# Patient Record
Sex: Female | Born: 1970 | Race: White | Hispanic: No | Marital: Married | Smoking: Never smoker
Health system: Southern US, Community
[De-identification: ages and names within clinical notes are randomized; demographics above are authoritative.]

## PROBLEM LIST (undated history)

## (undated) DIAGNOSIS — Z860101 Personal history of adenomatous and serrated colon polyps: Secondary | ICD-10-CM

## (undated) DIAGNOSIS — C4491 Basal cell carcinoma of skin, unspecified: Secondary | ICD-10-CM

## (undated) DIAGNOSIS — Z9889 Other specified postprocedural states: Secondary | ICD-10-CM

## (undated) DIAGNOSIS — Z1501 Genetic susceptibility to malignant neoplasm of breast: Secondary | ICD-10-CM

## (undated) DIAGNOSIS — E878 Other disorders of electrolyte and fluid balance, not elsewhere classified: Secondary | ICD-10-CM

## (undated) DIAGNOSIS — Z86010 Personal history of colonic polyps: Secondary | ICD-10-CM

## (undated) DIAGNOSIS — M8580 Other specified disorders of bone density and structure, unspecified site: Secondary | ICD-10-CM

## (undated) DIAGNOSIS — R112 Nausea with vomiting, unspecified: Secondary | ICD-10-CM

## (undated) DIAGNOSIS — I341 Nonrheumatic mitral (valve) prolapse: Secondary | ICD-10-CM

## (undated) DIAGNOSIS — N920 Excessive and frequent menstruation with regular cycle: Secondary | ICD-10-CM

## (undated) DIAGNOSIS — N950 Postmenopausal bleeding: Secondary | ICD-10-CM

## (undated) DIAGNOSIS — R42 Dizziness and giddiness: Secondary | ICD-10-CM

## (undated) DIAGNOSIS — F419 Anxiety disorder, unspecified: Secondary | ICD-10-CM

## (undated) DIAGNOSIS — S30811A Abrasion of abdominal wall, initial encounter: Secondary | ICD-10-CM

## (undated) HISTORY — PX: LASER ABLATION OF THE CERVIX: SHX1949

## (undated) HISTORY — PX: OTHER SURGICAL HISTORY: SHX169

## (undated) HISTORY — DX: Other disorders of electrolyte and fluid balance, not elsewhere classified: E87.8

## (undated) HISTORY — DX: Excessive and frequent menstruation with regular cycle: N92.0

## (undated) HISTORY — DX: Personal history of adenomatous and serrated colon polyps: Z86.0101

## (undated) HISTORY — PX: WISDOM TOOTH EXTRACTION: SHX21

## (undated) HISTORY — DX: Basal cell carcinoma of skin, unspecified: C44.91

## (undated) HISTORY — DX: Other specified disorders of bone density and structure, unspecified site: M85.80

## (undated) HISTORY — DX: Genetic susceptibility to malignant neoplasm of breast: Z15.01

## (undated) HISTORY — DX: Personal history of colonic polyps: Z86.010

## (undated) HISTORY — PX: MYRINGOTOMY: SUR874

## (undated) HISTORY — PX: EYE SURGERY: SHX253

## (undated) HISTORY — DX: Postmenopausal bleeding: N95.0

---
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
## 1998-03-25 HISTORY — PX: EYE SURGERY: SHX253

## 2004-01-18 ENCOUNTER — Other Ambulatory Visit: Admission: RE | Admit: 2004-01-18 | Discharge: 2004-01-18 | Payer: Self-pay | Admitting: Obstetrics & Gynecology

## 2005-01-18 ENCOUNTER — Other Ambulatory Visit: Admission: RE | Admit: 2005-01-18 | Discharge: 2005-01-18 | Payer: Self-pay | Admitting: Obstetrics & Gynecology

## 2007-02-16 ENCOUNTER — Ambulatory Visit (HOSPITAL_COMMUNITY): Admission: RE | Admit: 2007-02-16 | Discharge: 2007-02-16 | Payer: Self-pay | Admitting: Obstetrics & Gynecology

## 2008-06-10 ENCOUNTER — Ambulatory Visit: Payer: Self-pay | Admitting: Gastroenterology

## 2008-06-10 DIAGNOSIS — R197 Diarrhea, unspecified: Secondary | ICD-10-CM | POA: Insufficient documentation

## 2008-06-10 LAB — CONVERTED CEMR LAB
ALT: 20 units/L (ref 0–35)
AST: 21 units/L (ref 0–37)
Albumin: 3.8 g/dL (ref 3.5–5.2)
BUN: 11 mg/dL (ref 6–23)
Basophils Absolute: 0.1 10*3/uL (ref 0.0–0.1)
Basophils Relative: 0.7 % (ref 0.0–3.0)
CRP, High Sensitivity: <1 (ref 0.00–5.00)
Calcium: 9.1 mg/dL (ref 8.4–10.5)
Chloride: 104 meq/L (ref 96–112)
Creatinine, Ser: 0.6 mg/dL (ref 0.4–1.2)
Glucose, Bld: 98 mg/dL (ref 70–99)
HCT: 40.4 % (ref 36.0–46.0)
Hemoglobin: 14.1 g/dL (ref 12.0–15.0)
Lymphs Abs: 2.2 10*3/uL (ref 0.7–4.0)
MCV: 92.7 fL (ref 78.0–100.0)
Monocytes Relative: 5.7 % (ref 3.0–12.0)
Neutro Abs: 6.2 10*3/uL (ref 1.4–7.7)
Potassium: 4.3 meq/L (ref 3.5–5.1)
Total Bilirubin: 0.7 mg/dL (ref 0.3–1.2)

## 2008-06-29 ENCOUNTER — Encounter (INDEPENDENT_AMBULATORY_CARE_PROVIDER_SITE_OTHER): Payer: Self-pay | Admitting: *Deleted

## 2009-03-25 DIAGNOSIS — R42 Dizziness and giddiness: Secondary | ICD-10-CM

## 2009-03-25 HISTORY — DX: Dizziness and giddiness: R42

## 2009-06-09 ENCOUNTER — Telehealth: Payer: Self-pay | Admitting: Gastroenterology

## 2010-04-24 NOTE — Progress Notes (Signed)
Summary: Schedule REV  Phone Note Outgoing Call Call back at [facility] Phone [phone]   Call placed by: Harlow Mares CMA Duncan Dull),  June 09, 2009 9:46 AM Call placed to: Patient Summary of Call: Left message on patients machine to call back. patient needs an office visit Initial call taken by: Harlow Mares CMA Duncan Dull),  June 09, 2009 9:46 AM

## 2016-01-30 NOTE — H&P (Signed)
Peggy Obrien is an 45 y.o. female. She is admitted with intermenstrual spotting, menorrhagia, and a suspected 1 cm endometrial polyp of saline infusion sonography.  Pertinent Gynecological History: Last mammogram: normal Date: 2017 Last pap: normal Date: 2014 OB History: G0, P0    No past medical history on file.  No past surgical history on file.  No family history on file.  Social History:  has no tobacco, alcohol, and drug history on file.  Allergies:  Allergies  Allergen Reactions  . Codeine Nausea And Vomiting  . Lorazepam Other (See Comments)    agitation  . Xyrem [Sodium Oxybate] Nausea And Vomiting    No prescriptions prior to admission.    Review of Systems  Constitutional: Negative.   HENT: Negative.   Eyes: Negative.   Respiratory: Negative.   Cardiovascular: Negative.   Gastrointestinal: Negative.     There were no vitals taken for this visit. Physical Exam  Constitutional: She is oriented to person, place, and time. She appears well-developed and well-nourished.  HENT:  Head: Normocephalic.  Eyes: Pupils are equal, round, and reactive to light.  Neck: Normal range of motion.  Cardiovascular: Normal rate.   Respiratory: Effort normal.  GI: Soft.  Genitourinary: Vagina normal and uterus normal.  Neurological: She is alert and oriented to person, place, and time.    Office SIS: Normal sized uterus.  1 cm endometrial polyp noted.  Assessment/Plan: Patient with history of menorrhagia for which she takes [place].  She would be receptive to a spontaneous conception and pregnancy and does not want to take hormones or do anything that would impair fertility.  She developed intermenstrual spotting and an office SIS showed a polyp.  She presents for hysteroscopy, possible endometrial polypectomy, and endometrial curettage.  Aniyha Tate D 01/30/2016, 8:09 PM

## 2016-01-30 NOTE — Patient Instructions (Signed)
Your procedure is scheduled on:  Friday, Nov. 10, 2017  [facility] at:  8:45 AM  Pick up the phone at the desk and dial [phone].  Call this number if you have problems the morning of surgery: [phone].  Remember: Do NOT eat food or drink after:  Midnight Thursday  Take these medicines the morning of surgery with a SIP OF WATER:  None  Stop ALL herbal medications at this time   Do NOT wear jewelry (body piercing), metal hair clips/bobby pins, make-up, or nail polish. Do NOT wear lotions, powders, or perfumes.  You may wear deodorant. Do NOT shave for 48 hours prior to surgery. Do NOT bring valuables to the hospital. Contacts, dentures, or bridgework may not be worn into surgery.  Have a responsible adult drive you home and stay with you for 24 hours after your procedure

## 2016-01-31 ENCOUNTER — Encounter (HOSPITAL_COMMUNITY)
Admission: RE | Admit: 2016-01-31 | Discharge: 2016-01-31 | Disposition: A | Discharge status: Home / Self Care | Payer: 59 | Source: Ambulatory Visit | Attending: Obstetrics & Gynecology | Admitting: Obstetrics & Gynecology

## 2016-01-31 ENCOUNTER — Encounter (HOSPITAL_COMMUNITY): Payer: Self-pay

## 2016-01-31 DIAGNOSIS — N923 Ovulation bleeding: Secondary | ICD-10-CM | POA: Diagnosis not present

## 2016-01-31 DIAGNOSIS — N938 Other specified abnormal uterine and vaginal bleeding: Secondary | ICD-10-CM | POA: Diagnosis not present

## 2016-01-31 DIAGNOSIS — N840 Polyp of corpus uteri: Secondary | ICD-10-CM | POA: Diagnosis not present

## 2016-01-31 DIAGNOSIS — N920 Excessive and frequent menstruation with regular cycle: Secondary | ICD-10-CM | POA: Diagnosis not present

## 2016-01-31 HISTORY — DX: Abrasion of abdominal wall, initial encounter: S30.811A

## 2016-01-31 HISTORY — DX: Nausea with vomiting, unspecified: R11.2

## 2016-01-31 HISTORY — DX: Other specified postprocedural states: Z98.890

## 2016-01-31 HISTORY — DX: Dizziness and giddiness: R42

## 2016-01-31 LAB — CBC
HCT: 43.1 % (ref 36.0–46.0)
Hemoglobin: 15.3 g/dL — ABNORMAL HIGH (ref 12.0–15.0)
MCH: 33.3 pg (ref 26.0–34.0)
MCHC: 35.5 g/dL (ref 30.0–36.0)
MCV: 93.9 fL (ref 78.0–100.0)
PLATELETS: 317 10*3/uL (ref 150–400)
RBC: 4.59 MIL/uL (ref 3.87–5.11)
RDW: 12.3 % (ref 11.5–15.5)
WBC: 6.9 10*3/uL (ref 4.0–10.5)

## 2016-02-02 ENCOUNTER — Encounter (HOSPITAL_COMMUNITY)
Admission: RE | Disposition: A | Discharge status: Home / Self Care | Payer: Self-pay | Source: Ambulatory Visit | Attending: Obstetrics & Gynecology

## 2016-02-02 ENCOUNTER — Encounter (HOSPITAL_COMMUNITY): Payer: Self-pay

## 2016-02-02 ENCOUNTER — Ambulatory Visit (HOSPITAL_COMMUNITY)
Admission: RE | Admit: 2016-02-02 | Discharge: 2016-02-02 | Disposition: A | Discharge status: Home / Self Care | Payer: 59 | Source: Ambulatory Visit | Attending: Obstetrics & Gynecology | Admitting: Obstetrics & Gynecology

## 2016-02-02 ENCOUNTER — Ambulatory Visit (HOSPITAL_COMMUNITY): Payer: 59 | Admitting: Anesthesiology

## 2016-02-02 DIAGNOSIS — N923 Ovulation bleeding: Secondary | ICD-10-CM | POA: Insufficient documentation

## 2016-02-02 DIAGNOSIS — N938 Other specified abnormal uterine and vaginal bleeding: Secondary | ICD-10-CM | POA: Diagnosis not present

## 2016-02-02 DIAGNOSIS — N840 Polyp of corpus uteri: Secondary | ICD-10-CM | POA: Insufficient documentation

## 2016-02-02 DIAGNOSIS — N920 Excessive and frequent menstruation with regular cycle: Secondary | ICD-10-CM | POA: Insufficient documentation

## 2016-02-02 HISTORY — PX: POLYPECTOMY: SHX5525

## 2016-02-02 HISTORY — PX: DILATATION & CURETTAGE/HYSTEROSCOPY WITH MYOSURE: SHX6511

## 2016-02-02 LAB — PREGNANCY, URINE: Preg Test, Ur: NEGATIVE

## 2016-02-02 SURGERY — DILATATION & CURETTAGE/HYSTEROSCOPY WITH MYOSURE
Anesthesia: General | Site: Vagina

## 2016-02-02 MED ORDER — SODIUM CHLORIDE 0.9 % IJ SOLN
INTRAMUSCULAR | Status: AC
  Filled 2016-02-02: qty 50

## 2016-02-02 MED ORDER — SODIUM CHLORIDE 0.9 % IR SOLN
Status: DC | PRN
  Administered 2016-02-02: 3000 mL

## 2016-02-02 MED ORDER — MIDAZOLAM HCL 2 MG/2ML IJ SOLN
INTRAMUSCULAR | Status: AC
  Filled 2016-02-02: qty 2

## 2016-02-02 MED ORDER — LIDOCAINE HCL (CARDIAC) 20 MG/ML IV SOLN
INTRAVENOUS | Status: AC
  Filled 2016-02-02: qty 5

## 2016-02-02 MED ORDER — SCOPOLAMINE 1 MG/3DAYS TD PT72
MEDICATED_PATCH | TRANSDERMAL | Status: AC
  Filled 2016-02-02: qty 1

## 2016-02-02 MED ORDER — ONDANSETRON HCL 4 MG/2ML IJ SOLN
INTRAMUSCULAR | Status: AC
  Filled 2016-02-02: qty 2

## 2016-02-02 MED ORDER — LIDOCAINE HCL (CARDIAC) 20 MG/ML IV SOLN
INTRAVENOUS | Status: DC | PRN
  Administered 2016-02-02: 30 mg via INTRAVENOUS
  Administered 2016-02-02: 70 mg via INTRAVENOUS

## 2016-02-02 MED ORDER — FENTANYL CITRATE (PF) 100 MCG/2ML IJ SOLN
INTRAMUSCULAR | Status: AC
  Filled 2016-02-02: qty 2

## 2016-02-02 MED ORDER — PROPOFOL 10 MG/ML IV BOLUS
INTRAVENOUS | Status: AC
  Filled 2016-02-02: qty 20

## 2016-02-02 MED ORDER — KETOROLAC TROMETHAMINE 30 MG/ML IJ SOLN: 30.0000 mg | Freq: Once | INTRAMUSCULAR | Status: DC | PRN

## 2016-02-02 MED ORDER — FLUMAZENIL 0.5 MG/5ML IV SOLN
INTRAVENOUS | Status: DC | PRN
  Administered 2016-02-02: 0.2 mg via INTRAVENOUS

## 2016-02-02 MED ORDER — VASOPRESSIN 20 UNIT/ML IV SOLN
INTRAVENOUS | Status: AC
  Filled 2016-02-02: qty 1

## 2016-02-02 MED ORDER — KETOROLAC TROMETHAMINE 30 MG/ML IJ SOLN
INTRAMUSCULAR | Status: AC
  Filled 2016-02-02: qty 1

## 2016-02-02 MED ORDER — DEXAMETHASONE SODIUM PHOSPHATE 10 MG/ML IJ SOLN
INTRAMUSCULAR | Status: DC | PRN
  Administered 2016-02-02: 4 mg via INTRAVENOUS

## 2016-02-02 MED ORDER — LIDOCAINE HCL 2 % IJ SOLN
INTRAMUSCULAR | Status: DC | PRN
  Administered 2016-02-02: 10 mL

## 2016-02-02 MED ORDER — KETOROLAC TROMETHAMINE 30 MG/ML IJ SOLN
INTRAMUSCULAR | Status: DC | PRN
  Administered 2016-02-02: 30 mg via INTRAVENOUS

## 2016-02-02 MED ORDER — LACTATED RINGERS IV SOLN
INTRAVENOUS | Status: DC
  Administered 2016-02-02 (×2): via INTRAVENOUS

## 2016-02-02 MED ORDER — HYDROMORPHONE HCL 1 MG/ML IJ SOLN: 0.2500 mg | INTRAMUSCULAR | Status: DC | PRN

## 2016-02-02 MED ORDER — PROPOFOL 10 MG/ML IV BOLUS
INTRAVENOUS | Status: DC | PRN
  Administered 2016-02-02: 200 mg via INTRAVENOUS

## 2016-02-02 MED ORDER — LIDOCAINE HCL 2 % IJ SOLN
INTRAMUSCULAR | Status: AC
  Filled 2016-02-02: qty 20

## 2016-02-02 MED ORDER — PROMETHAZINE HCL 25 MG/ML IJ SOLN: 6.2500 mg | INTRAMUSCULAR | Status: DC | PRN

## 2016-02-02 MED ORDER — MEPERIDINE HCL 25 MG/ML IJ SOLN: 6.2500 mg | INTRAMUSCULAR | Status: DC | PRN

## 2016-02-02 MED ORDER — DEXAMETHASONE SODIUM PHOSPHATE 4 MG/ML IJ SOLN
INTRAMUSCULAR | Status: AC
  Filled 2016-02-02: qty 1

## 2016-02-02 MED ORDER — SCOPOLAMINE 1 MG/3DAYS TD PT72
1.0000 | MEDICATED_PATCH | Freq: Once | TRANSDERMAL | Status: DC
  Administered 2016-02-02: 1.5 mg via TRANSDERMAL

## 2016-02-02 MED ORDER — MIDAZOLAM HCL 2 MG/2ML IJ SOLN
INTRAMUSCULAR | Status: DC | PRN
  Administered 2016-02-02: 1 mg via INTRAVENOUS

## 2016-02-02 MED ORDER — ONDANSETRON HCL 4 MG/2ML IJ SOLN
INTRAMUSCULAR | Status: DC | PRN
  Administered 2016-02-02: 4 mg via INTRAVENOUS

## 2016-02-02 MED ORDER — FENTANYL CITRATE (PF) 100 MCG/2ML IJ SOLN
INTRAMUSCULAR | Status: DC | PRN
  Administered 2016-02-02 (×2): 50 ug via INTRAVENOUS

## 2016-02-02 SURGICAL SUPPLY — 20 items
CANISTER SUCT 3000ML (MISCELLANEOUS) ×4 IMPLANT
CATH ROBINSON RED A/P 16FR (CATHETERS) ×3 IMPLANT
CLOTH BEACON ORANGE TIMEOUT ST (SAFETY) ×3 IMPLANT
CONTAINER PREFILL 10% NBF 60ML (FORM) ×6 IMPLANT
DEVICE MYOSURE LITE (MISCELLANEOUS) ×2 IMPLANT
DEVICE MYOSURE REACH (MISCELLANEOUS) IMPLANT
ELECT REM PT RETURN 9FT ADLT (ELECTROSURGICAL)
ELECTRODE REM PT RTRN 9FT ADLT (ELECTROSURGICAL) IMPLANT
FILTER ARTHROSCOPY CONVERTOR (FILTER) ×3 IMPLANT
GLOVE BIOGEL PI IND STRL 7.0 (GLOVE) ×1 IMPLANT
GLOVE BIOGEL PI INDICATOR 7.0 (GLOVE) ×2
GLOVE ECLIPSE 6.0 STRL STRAW (GLOVE) ×3 IMPLANT
GOWN STRL REUS W/TWL LRG LVL3 (GOWN DISPOSABLE) ×6 IMPLANT
PACK VAGINAL MINOR WOMEN LF (CUSTOM PROCEDURE TRAY) ×3 IMPLANT
PAD OB MATERNITY 4.3X12.25 (PERSONAL CARE ITEMS) ×3 IMPLANT
SEAL ROD LENS SCOPE MYOSURE (ABLATOR) ×3 IMPLANT
TOWEL OR 17X24 6PK STRL BLUE (TOWEL DISPOSABLE) ×6 IMPLANT
TUBING AQUILEX INFLOW (TUBING) ×3 IMPLANT
TUBING AQUILEX OUTFLOW (TUBING) ×3 IMPLANT
WATER STERILE IRR 1000ML POUR (IV SOLUTION) ×3 IMPLANT

## 2016-02-02 NOTE — Op Note (Signed)
Patient Name: Normand Slooplisa S Grima MRN: [id number]  Date of Surgery: 02/02/2016    PREOPERATIVE DIAGNOSIS: MENORRHAGIA Dysfunctional Uterine Bleeding POLYP  POSTOPERATIVE DIAGNOSIS: MENORRHAGIA Dysfunctional Uterine Bleeding   PROCEDURE: Hysteroscopy, polypectomy, endometrial curettage  SURGEON: Caralyn Guileichard D. Arlyce DiceKaplan M.D.  ANESTHESIA: General  ESTIMATED BLOOD LOSS: Minimal  FINDINGS: Small endometrial polyp in lower segment.  Otherwise normal endometrial cavity.   INDICATIONS: Intermenstrual bleeding, menorrhagia  PROCEDURE IN DETAIL: The patient was taken to the OR and placed in the dors-lithotomy position. The perineum and vagina were prepped and draped in a sterile fashion. Bimanual exam revealed a retroverted normal sized uterus. 10 ml of 2% lidocaine was infiltrated in the paracervical tissue and Pratt dilators were used to open the cervix to 21 [place]. The myosure hysteroscope was introduced and the myosure probe was used to remove a small polyp.  An endometrial curettage was then carried out. The procedure was then terminated and the patient left the operating room in good condition.

## 2016-02-02 NOTE — Anesthesia Postprocedure Evaluation (Signed)
Anesthesia Post Note  Patient: Normand Slooplisa S Marston  Procedure(s) Performed: Procedure(s) (LRB): DILATATION & CURETTAGE/HYSTEROSCOPY WITH MYOSURE (N/A) POLYPECTOMY (N/A)  Patient location during evaluation: PACU Anesthesia Type: General Level of consciousness: awake and alert and oriented Pain management: pain level controlled Vital Signs Assessment: post-procedure vital signs reviewed and stable Respiratory status: spontaneous breathing, nonlabored ventilation and respiratory function stable Cardiovascular status: blood pressure returned to baseline and stable Postop Assessment: no signs of nausea or vomiting Anesthetic complications: no     Last Vitals:  Vitals:   02/02/16 1215 02/02/16 1230  BP: 132/87 134/83  Pulse: 95 80  Resp: 18 14  Temp:      Last Pain:  Vitals:   02/02/16 1240  TempSrc:   PainSc: 1    Pain Goal: Patients Stated Pain Goal: 0 (02/02/16 1113)               Chea Malan A.

## 2016-02-02 NOTE — Anesthesia Procedure Notes (Signed)
Procedure Name: LMA Insertion Date/Time: 02/02/2016 10:37 AM Performed by: Suella GroveMOORE, Adilen Pavelko C Pre-anesthesia Checklist: Patient identified, Emergency Drugs available, Patient being monitored and Suction available Patient Re-evaluated:Patient Re-evaluated prior to inductionOxygen Delivery Method: Circle system utilized and Simple face mask Preoxygenation: Pre-oxygenation with 100% oxygen Intubation Type: IV induction and Inhalational induction Ventilation: Mask ventilation without difficulty LMA: LMA inserted LMA Size: 4.0 Grade View: Grade II Tube type: Oral Number of attempts: 1 Placement Confirmation: positive ETCO2 and breath sounds checked- equal and bilateral Dental Injury: Teeth and Oropharynx as per pre-operative assessment

## 2016-02-02 NOTE — Transfer of Care (Signed)
Immediate Anesthesia Transfer of Care Note  Patient: Peggy SloopAlisa S Obrien  Procedure(s) Performed: Procedure(s): DILATATION & CURETTAGE/HYSTEROSCOPY WITH MYOSURE (N/A) POLYPECTOMY (N/A)  Patient Location: PACU  Anesthesia Type:General  Level of Consciousness: awake, alert , oriented and patient cooperative  Airway & Oxygen Therapy: Patient Spontanous Breathing and Patient connected to nasal cannula oxygen  Post-op Assessment: Report given to RN and Post -op Vital signs reviewed and stable  Post vital signs: Reviewed and stable  Last Vitals:  Vitals:   02/02/16 0909  BP: 129/83  Pulse: 79  Resp: 14  Temp: 36.8 C    Last Pain:  Vitals:   02/02/16 0909  TempSrc: Oral      Patients Stated Pain Goal: 3 (02/02/16 0909)  Complications: No apparent anesthesia complications

## 2016-02-02 NOTE — Discharge Instructions (Signed)

## 2016-02-02 NOTE — Anesthesia Preprocedure Evaluation (Signed)
Anesthesia Evaluation  Patient identified by MRN, date of birth, ID band Patient awake    Reviewed: Allergy & Precautions, NPO status , Patient's Chart, lab work & pertinent test results  History of Anesthesia Complications (+) PONV and history of anesthetic complications  Airway Mallampati: II  TM Distance: >3 FB Neck ROM: Full    Dental no notable dental hx.    Pulmonary neg pulmonary ROS,    Pulmonary exam normal breath sounds clear to auscultation       Cardiovascular negative cardio ROS Normal cardiovascular exam Rhythm:Regular Rate:Normal     Neuro/Psych negative neurological ROS  negative psych ROS   GI/Hepatic negative GI ROS, Neg liver ROS,   Endo/Other  negative endocrine ROS  Renal/GU negative Renal ROS     Musculoskeletal negative musculoskeletal ROS (+)   Abdominal   Peds  Hematology negative hematology ROS (+)   Anesthesia Other Findings   Reproductive/Obstetrics negative OB ROS                             Anesthesia Physical Anesthesia Plan  ASA: II  Anesthesia Plan: General   Post-op Pain Management:    Induction: Intravenous  Airway Management Planned: LMA  Additional Equipment:   Intra-op Plan:   Post-operative Plan: Extubation in OR  Informed Consent: I have reviewed the patients History and Physical, chart, labs and discussed the procedure including the risks, benefits and alternatives for the proposed anesthesia with the patient or authorized representative who has indicated his/her understanding and acceptance.   Dental advisory given  Plan Discussed with: CRNA  Anesthesia Plan Comments:        Anesthesia Quick Evaluation

## 2016-02-02 NOTE — Progress Notes (Signed)
I have interviewed and performed the pertinent exams on my patient to confirm that there have been no significant changes in her condition since the dictation of her history and physical exam.  

## 2016-02-04 ENCOUNTER — Encounter (HOSPITAL_COMMUNITY): Payer: Self-pay | Admitting: Obstetrics & Gynecology

## 2016-11-27 DIAGNOSIS — F5101 Primary insomnia: Secondary | ICD-10-CM | POA: Insufficient documentation

## 2017-06-23 ENCOUNTER — Ambulatory Visit (INDEPENDENT_AMBULATORY_CARE_PROVIDER_SITE_OTHER): Payer: 59 | Admitting: Sports Medicine

## 2017-06-23 ENCOUNTER — Encounter: Payer: Self-pay | Admitting: Sports Medicine

## 2017-06-23 VITALS — BP 120/84 | Ht 66.0 in | Wt 131.0 lb

## 2017-06-23 DIAGNOSIS — S76112A Strain of left quadriceps muscle, fascia and tendon, initial encounter: Secondary | ICD-10-CM

## 2017-06-23 NOTE — Assessment & Plan Note (Signed)
Patient presents with left quad pain/tenderness while playing tennis. Patient had similar problem with right thigh a few days prior. Patient has been taking NSAIDs (Advil) in addition to doing more muscle group stretching with some noted improvement on the right but minimal change on the left. Patient has also been riding a recumbent bike which could have contributed to her pain due to improper positioning/form. Patient has good understanding of muscle stretching and has good exercise regimen, however she has continued to overuse and overstretch her left quad in the setting of a strain. Exam was consistent with IT band tightness on the right leg and abduction weakness and quad tenderness on the left leg. Discuss targeting particular muscle groups with specific exercises to strengthen muscle. Patient will also discontinue NSAIDs as it appears there are no signs of inflammation. She will continue with stretching exercise making sure she does not overstretch her strain left quad. Patient will continue with mike but will need to readjust position to avoid overstretch. --Given Eccentric exercises for left quad --Given strengthening exercise for left Abductor weakness --Given exercise for right IT band tightness --Use thigh sleeves until symptoms completely resolve or improve significantly --Discontinue Advil or any kind of NSAIDs --Follow up as needed

## 2017-06-23 NOTE — Patient Instructions (Signed)
It was great seeing you today! We have addressed the following issues today  1. As discussed today will have you work on:   -IT band exercises on the right leg   -Hip abductor strengthening exercises for left hip and eccentric   exercises for your left  Quad. 2. Stop taking the Advil. 3. Continue stretching as we have been doing 4. Continue using your recumbent bike, make sure your maintain proper form. 5. Wear your thigh sleeves while playing for additional support.        If we did any lab work today, and the results require attention, either me or my nurse will get in touch with you. If everything is normal, you will get a letter in mail and a message via . If you don't hear from us in two weeks, please give us a call. Otherwise, we look forward to seeing you again at your next visit. If you have any questions or concerns before then, please call the clinic at [phone].  Please bring all your medications to every doctors visit  Sign up for My Chart to have easy access to your labs results, and communication with your Primary care physician. Please ask Front Desk for some assistance.   Please check-out at the front desk before leaving the clinic.    Take Care,   Dr. Sydnee Cabaliallo

## 2017-06-23 NOTE — Progress Notes (Signed)
Subjective:    Patient ID: Peggy Obrien, female    DOB: 07/30/1970, 47 y.o.   MRN: [id number]   CC: Left quadriceps pain  HPI: Patient is a 47 year old female who presents today complaining of left quad pain. Patient reports she first right quadriceps pain playing tennis about 10 days ago. She has not play in a while and realize she did not stretch properly prior to playing.  Patient reports that since then she has been stretching and taking Advil with some improvement in right quad pain.  She reports that her left quad started  leg about a week ago while playing tennis.  Patient also reports that she has been using a recumbent bike for the past 10 days also had bilateral inner thigh pain/tenderness.  Patient continued to stretch using specific quad exercises prior to playing tennis.  She denies any hip pain, swelling, bruising/ecchymosis.  Patient has recently purchased some thigh sleeve but has not used them as yet.   Smoking status reviewed   ROS: all other systems were reviewed and are negative other than in the HPI   Past Medical History:  Diagnosis Date  . 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 Flank abrasion    left flank inflammation  . PONV (postoperative nausea and vomiting)     Past Surgical History:  Procedure Laterality Date  . DILATATION & CURETTAGE/HYSTEROSCOPY WITH MYOSURE N/A 02/02/2016   Procedure: DILATATION & CURETTAGE/HYSTEROSCOPY WITH MYOSURE;  Surgeon: Ilda Moriichard Kaplan, MD;  Location: [facility];  Service: Gynecology;  Laterality: N/A;  . EYE SURGERY     lasix  . LASER ABLATION OF THE CERVIX    . MYRINGOTOMY Bilateral   . POLYPECTOMY N/A 02/02/2016   Procedure: POLYPECTOMY;  Surgeon: Ilda Moriichard Kaplan, MD;  Location: [facility];  Service: Gynecology;  Laterality: N/A;  . WISDOM TOOTH EXTRACTION      Past medical history, surgical, family, and social history reviewed and updated in the EMR as appropriate.  Objective:  BP 120/84   Ht 5\' 6"  (1.676 m)   Wt 131 lb (59.4 kg)    BMI 21.14 kg/m   Vitals and nursing note reviewed  General: NAD, pleasant, able to participate in exam Cardiac: RRR, normal heart sounds, no murmurs. 2+ radial and PT pulses bilaterally Respiratory: CTAB, normal effort, No wheezes, rales or rhonchi Abdomen: soft, nontender, nondistended, no hepatic or splenomegaly, +BS Hip Exam: Patient with normal ABduction of the right hip but decrease strentgh of the left hip ABduction. Hip ADduction intact bilaterally. IT band tightness noted with internal rotation of the right knee. No swelling or bruising noted. Mild calves tenderness on palpation bilaterally without any swelling or bruising Extremities: no edema or cyanosis. WWP. Skin: warm and dry, no rashes noted Neuro: alert and oriented x4, no focal deficits Psych: Normal affect and mood   Assessment & Plan:    Quadriceps muscle strain, left, initial encounter Patient presents with left quad pain/tenderness while playing tennis. Patient had similar problem with right thigh a few days prior. Patient has been taking NSAIDs (Advil) in addition to doing more muscle group stretching with some noted improvement on the right but minimal change on the left. Patient has also been riding a recumbent bike which could have contributed to her pain due to improper positioning/form. Patient has good understanding of muscle stretching and has good exercise regimen, however she has continued to overuse and overstretch her left quad in the setting of a strain. Exam was consistent with IT band  tightness on the right leg and abduction weakness and quad tenderness on the left leg. Discuss targeting particular muscle groups with specific exercises to strengthen muscle. Patient will also discontinue NSAIDs as it appears there are no signs of inflammation. She will continue with stretching exercise making sure she does not overstretch her strain left quad. Patient will continue with mike but will need to readjust position to  avoid overstretch. --Given Eccentric exercises for left quad --Given strengthening exercise for left Abductor weakness --Given exercise for right IT band tightness --Use thigh sleeves until symptoms completely resolve or improve significantly --Discontinue Advil or any kind of NSAIDs --Follow up as needed    Lovena Neighbours, MD [facility] PGY-2  Patient seen and evaluated with the resident. I agree with the above plan of care. Follow-up as needed.

## 2017-08-21 ENCOUNTER — Encounter: Payer: Self-pay | Admitting: Sports Medicine

## 2017-08-21 ENCOUNTER — Ambulatory Visit (INDEPENDENT_AMBULATORY_CARE_PROVIDER_SITE_OTHER): Payer: 59 | Admitting: Sports Medicine

## 2017-08-21 VITALS — BP 110/80 | Ht 66.0 in | Wt 130.0 lb

## 2017-08-21 DIAGNOSIS — M542 Cervicalgia: Secondary | ICD-10-CM | POA: Diagnosis not present

## 2017-08-21 DIAGNOSIS — S46219A Strain of muscle, fascia and tendon of other parts of biceps, unspecified arm, initial encounter: Secondary | ICD-10-CM | POA: Diagnosis not present

## 2017-08-21 NOTE — Progress Notes (Signed)
   Subjective:    Patient ID: Peggy Obrien, female    DOB: 1970-09-29, 47 y.o.   MRN: [id number]  HPI chief complaint: Right shoulder and neck pain  Patient comes in today with a couple of different complaints. She's complaining of some anterior shoulder pain that began acutely a couple weeks ago without any known trauma. She initially had difficulty reaching directly overhead due to her pain but that has improved. She has some home exercises that she was given 5 years ago after a similar complaint and she has been faithful about doing them. Although her shoulder pain is improving, she has recently began to experience some pain in her neck. She was doing some exercises for lateral epicondylitis which required a lot of twisting and bending of her wrists and arms and this may have been the inciting event. Neck pain is most noticeable with movement. It is not tender to palpation. She does notice some improvement with massage using a tennis ball. She has been using KT tape on her shoulder with some relief. She's also using heat.  Interm medical history reviewed Medications reviewed Allergies reviewed    Review of Systems    as above Objective:   Physical Exam  Well-developed, well-nourished. No acute distress. Awake alert and oriented 3. Vital signs reviewed  Cervical spine: Good cervical range of motion. No tenderness to palpation over the cervical midline. There is tenderness to palpation just to the right of midline as well as into the right trapezius and right parascapular area. No spasm.  Right shoulder: Smooth painless range of motion. Negative painful arc. Negative empty can, negative Hawkins. Rotator cuff strength is 5/5 and does not reproduce pain. No tenderness over the acromioclavicular joint. No tenderness over the bicipital groove. Negative speed's, negative Yergason's. Good grip strength. Good pulses.      Assessment & Plan:   Right shoulder pain secondary to biceps tendon  strain-improving Cervical strain  I had a long conversation with the patient regarding these injuries. Although her shoulder pain is not reproducible today, her location of pain is in the area of the biceps tendon. I reviewed her home exercises which are quite adequate. They consist of rotator cuff strengthening and scapular stabilization exercises. I recommended that she continue with those and avoid any strengthening exercises that require motion above shoulder level. She may also continue with KT taping if she finds it helpful.  For her cervical strain, I recommended moist heat and activity as tolerated. Her previous shoulder injury was secondary to a change in rackets but it doesn't sound like there is been any change in her grip, form, or string tension recently. If symptoms persist, I would consider formal physical therapy since she has had good results with that in the past. Patient will follow-up for ongoing or recalcitrant issues.  Total time spent with the patient was 15 minutes with greater than 50% of the time spent in face-to-face consultation discussing her diagnosis and treatment plan.

## 2017-09-23 ENCOUNTER — Ambulatory Visit (INDEPENDENT_AMBULATORY_CARE_PROVIDER_SITE_OTHER): Payer: 59 | Admitting: Sports Medicine

## 2017-09-23 ENCOUNTER — Encounter: Payer: Self-pay | Admitting: Sports Medicine

## 2017-09-23 VITALS — BP 134/78 | Ht 66.0 in | Wt 131.0 lb

## 2017-09-23 DIAGNOSIS — M7661 Achilles tendinitis, right leg: Secondary | ICD-10-CM

## 2017-09-23 DIAGNOSIS — M542 Cervicalgia: Secondary | ICD-10-CM

## 2017-09-23 NOTE — Progress Notes (Signed)
Subjective:    Patient ID: Peggy Obrien, female    DOB: 09/16/1970, 47 y.o.   MRN: [id number]  HPI Patient is a 47 yo female who present here today to follow up on left shoulder/upper back pain. Patient was recently seen in clinic (5/30) for right shoulder and neck pain. Patient was given home exercise which patient reports she has been doing as instructed. Pain has improved significantly since she was last seen in clinic however she still endorses left scapular/paraspinal dull pain rated 2/10. Patient is a Armed forces operational officertennis player and does not feel like she is quite herself yet and would like to have some physical therapy in addition to home exercise to help her speed up her recovery time.  Patient also complains of achilles pain which started this morning. Patient reports that that Sunday she did her dynamic warm exercises inside her house wearing regular shoes and noticed foot was not stable. Today, patient has some tenderness around her achilles on her right foot   Review of Systems  Constitutional: Negative.   HENT: Negative.   Musculoskeletal:       Right achilles pain  Left shoulder pain  Skin: Negative.   Neurological: Negative.       Objective:   Physical Exam Left upper back: No deformity, swelling, erythema. Tender to palpation in the medial aspect of left scapula and paraspinal region in the lower cervical and upper thoracic region. Full ROM neck and bilateral shoulder. Strength 5/5 and sensation intact in the neck and upper back region. No scapular winging or dyssymmetry noted on exam.   Right achilles tendon: No swelling, deformity, erythema noted. TTP in the mid substance of the tendon. No calcaneus pain. Right ankle full ROM. No signs of rupture.  Strength 5/5, sensation intact. Palpable pulse.     Assessment & Plan:   #Left upper back/scapula pain, chronic, improving Patient presents with left paraspinal and medial scapula pain reproducible on exam with palpation. Patient has  been doing exercises as instructed during his last office visit for her right shoulder and neck pain. This pain appear to be related to the pain she was experiencing during las office visit. She feels that she would benefit from PT because she has had shoulder pain/ weakness in the past which improve and completed resolved with PT. Pain she is currently experiencing likely secondary weakness in rotator cuff causing over compensation in neck and upper back muscles. --Will refer to PT for scapular and rotator cuff strengthening. --Return to activity (tennis) as allowed  -- Follow up ion clinic in the next 4-6 weeks as needed  #Right achilles pain, acute  Patient with right achilles pain starting this morning. Recent exercise routine with reported instability of her foot and ankle due to improper footwear. Pain likely secondary to that.  --Icing 2-3 times a day  --Additional heel support with workout shoes, avoid sandals or flat shoes until pain has improved --If symptoms do not improve in the next few days, patient will be given heel stretching home exercises.   Patient seen and evaluated with the resident. I agree with the above plan of care. Patient has a physical therapist at [facility] that she would like to work with. I've given her a prescription to take to PT with instructions to wean to home exercise program as tolerated. In regards to her right Achilles pain, it is very acute. Treatment as above, but if symptoms persist for another week or so then the patient will call  the office and we will email her the [place] heel drop protocol. Otherwise, follow-up for ongoing or recalcitrant issues.

## 2017-09-24 ENCOUNTER — Encounter: Payer: Self-pay | Admitting: Sports Medicine

## 2017-11-25 ENCOUNTER — Ambulatory Visit (INDEPENDENT_AMBULATORY_CARE_PROVIDER_SITE_OTHER): Payer: 59 | Admitting: Sports Medicine

## 2017-11-25 VITALS — BP 102/60 | Ht 66.0 in | Wt 132.0 lb

## 2017-11-25 DIAGNOSIS — S39012A Strain of muscle, fascia and tendon of lower back, initial encounter: Secondary | ICD-10-CM | POA: Diagnosis not present

## 2017-11-25 NOTE — Progress Notes (Signed)
   Subjective:    Patient ID: Peggy Obrien, female    DOB: 09-Feb-1971, 47 y.o.   MRN: [id number]  HPI: 46yoF tennis player with hx sciatica presents for evaluation of right sided lumbar back pain. Pain started about 1 week ago. Initially noticed while playing tennis. Patient states she took a step while walking up to the net and felt a sharp achy pain on right side of lower back that radiated into her gluteus maximus. She went that afternoon to [facility] and had a "hip readjustment" that seemed to slightly improve her pain.  Since incident, patient has been using ice, heat, Tylenol, and Advil 600mg  BID. She has also continued w/ piriformis exercises and stretches which she believes has helped with pain.  Today she reports pain is minimal without radiation. She also does not associate any back pain while walking, standing,siting, bending, or while performing her stretches. Of note, she does report radiation of pain into left inner thigh 3 days ago when she stumbled over herself. This pain lasted only 2 hours and has not re-occurred since incident.  She denies and fevers, chills, hip pain, knee pain, loss of bowel or bladder control, change in sensation of LE, or weakness in LE.    Review of Systems As per above PMH and medications have been reviewed.    Objective:   Physical Exam  G: Alert, oriented, cooperative, pleasant, in no acute distress PULM: no conversational dyspnea, no respiratory distress LOWER BACK: no tenderness, ROM intact, no reproducible pain with flexion, extension, or twisting of LB. Negative straight leg test.  Negative Faber and Fadir of hips bilaterally. NEURO: strength is 5/5 in both lower extremity's. Sensation is intact to light touch grossly. Achilles and patellar reflexes are equal bilaterally.      Assessment & Plan:  Right Lower Back Pain most likely from lumbar strain  Since pain has been improving, I have advised patient to continue to use moist heat and  Diclofenac PO (which patient has at home) with food BID for 5 days. She is able to return to tennis next week on Monday (6 days from today) for her tennis clinic. She was advised to rest and not attend [facility] exercise program that she has scheduled for later on today.  We did discuss the importance of continuing her piriformis exercises as they have seem to help with pain.  Patient is to call me on Monday if back pain has not improved with the above regimen.  If back pain does not improve, consider sending to PT. She has done well with PT in the past.

## 2017-11-26 ENCOUNTER — Encounter: Payer: Self-pay | Admitting: Sports Medicine

## 2018-01-05 ENCOUNTER — Ambulatory Visit
Admission: RE | Admit: 2018-01-05 | Discharge: 2018-01-05 | Disposition: A | Discharge status: Home / Self Care | Payer: 59 | Source: Ambulatory Visit | Attending: Sports Medicine | Admitting: Sports Medicine

## 2018-01-05 ENCOUNTER — Ambulatory Visit (INDEPENDENT_AMBULATORY_CARE_PROVIDER_SITE_OTHER): Payer: 59 | Admitting: Sports Medicine

## 2018-01-05 ENCOUNTER — Encounter: Payer: Self-pay | Admitting: Sports Medicine

## 2018-01-05 VITALS — BP 112/70 | Ht 65.75 in | Wt 131.0 lb

## 2018-01-05 DIAGNOSIS — S39012D Strain of muscle, fascia and tendon of lower back, subsequent encounter: Secondary | ICD-10-CM | POA: Diagnosis not present

## 2018-01-06 NOTE — Progress Notes (Signed)
   Subjective:    Patient ID: Peggy Obrien, female    DOB: 23-Oct-1970, 47 y.o.   MRN: [id number]  HPI   Patient comes in today for follow-up on low back pain.  Overall, she is about 40 to 60% better.  She still describes an achy discomfort diffuse across her low back with certain activities such as tennis.  Advil does help.  She takes about 600 mg before playing tennis.  She will occasionally get pain with sitting as well.  No groin pain.  No numbness or tingling down her legs.  Pain will occasionally radiate down the back of her leg but not past the knee.   Review of Systems    As above Objective:   Physical Exam  Well-developed, well-nourished.  No acute distress.  Awake alert and oriented x3.  Vital signs reviewed.  She is sitting comfortably in the exam room  Lumbar spine: There is some slight tenderness to palpation diffusely across the lower lumbar spine.  No spasm.  She has excellent lumbar range of motion.  Some pain with forward flexion and some pain with extension.  Negative straight leg raise.  There is no gross focal neurological deficit of either lower extremity.  X-rays of her lumbar spine including AP and lateral views are unremarkable.  No significant degenerative disc disease.  No significant facet arthropathy.      Assessment & Plan:   Mechanical low back pain  Patient would benefit from formal physical therapy.  I think she would also benefit from Pilates at some point down the road.  I will refer her to physical therapy and see her back in 6 weeks.  I think she can continue with activity as tolerated using pain as her guide and she is okay to continue using ibuprofen as needed for pain.  She will call with questions or concerns prior to her follow-up visit.

## 2018-02-10 ENCOUNTER — Encounter: Payer: Self-pay | Admitting: Sports Medicine

## 2018-02-10 ENCOUNTER — Ambulatory Visit (INDEPENDENT_AMBULATORY_CARE_PROVIDER_SITE_OTHER): Payer: 59 | Admitting: Sports Medicine

## 2018-02-10 VITALS — BP 105/68 | Ht 66.0 in | Wt 131.0 lb

## 2018-02-10 DIAGNOSIS — S39012D Strain of muscle, fascia and tendon of lower back, subsequent encounter: Secondary | ICD-10-CM

## 2018-02-11 NOTE — Progress Notes (Signed)
   Subjective:    Patient ID: Peggy SloopAlisa S Obrien, female    DOB: 12/04/1970, 47 y.o.   MRN: [id number]  HPI   Patient comes in today for follow-up on a low back strain.  She is doing very well.  She is ready to be discharged from formal PT to a home exercise program.  She has returned to playing 2-1/2 hours of tennis with only mild discomfort.  She does still take Motrin 600 mg after playing but she denies taking it at other times.  She has also found that a lumbar roll that she purchased is very helpful.  She is excited about starting Pilates in the near future.   Review of Systems    As above Objective:   Physical Exam  Well-developed, fit appearing.  No acute distress.  Awake alert and oriented x3.  Vital signs reviewed  Lumbar spine: Full painless lumbar range of motion.  No tenderness to palpation along the lumbar midline or paraspinal musculature.  No spasm.  No gross neurological deficit of either lower extremity.      Assessment & Plan:  Resolving lumbar strain  Patient is doing very well.  She will transition from formal physical therapy to Pilates.  I would like for her to try to wean completely off of her Motrin.  I reassured her that her x-rays of her lumbar spine are unremarkable.  I think she is okay to continue to increase activity as tolerated and follow-up with me as needed.

## 2018-02-26 ENCOUNTER — Other Ambulatory Visit: Payer: Self-pay | Admitting: Obstetrics and Gynecology

## 2018-02-26 DIAGNOSIS — Z1501 Genetic susceptibility to malignant neoplasm of breast: Secondary | ICD-10-CM

## 2018-03-25 HISTORY — PX: HYSTEROSCOPY WITH D & C: SHX1775

## 2018-03-25 HISTORY — PX: COLONOSCOPY W/ POLYPECTOMY: SHX1380

## 2018-04-21 ENCOUNTER — Ambulatory Visit: Payer: 59

## 2018-04-22 ENCOUNTER — Ambulatory Visit: Payer: 59

## 2018-04-23 ENCOUNTER — Ambulatory Visit
Admission: RE | Admit: 2018-04-23 | Discharge: 2018-04-23 | Disposition: A | Discharge status: Home / Self Care | Payer: Self-pay | Source: Ambulatory Visit | Attending: Obstetrics and Gynecology | Admitting: Obstetrics and Gynecology

## 2018-04-23 DIAGNOSIS — Z1501 Genetic susceptibility to malignant neoplasm of breast: Secondary | ICD-10-CM

## 2018-04-23 MED ORDER — GADOBUTROL 1 MMOL/ML IV SOLN
7.0000 mL | Freq: Once | INTRAVENOUS | Status: AC | PRN
  Administered 2018-04-23: 7 mL via INTRAVENOUS

## 2018-04-28 ENCOUNTER — Ambulatory Visit: Payer: Self-pay

## 2018-04-28 ENCOUNTER — Ambulatory Visit (INDEPENDENT_AMBULATORY_CARE_PROVIDER_SITE_OTHER): Payer: 59 | Admitting: Sports Medicine

## 2018-04-28 VITALS — BP 112/70 | Ht 66.0 in | Wt 131.0 lb

## 2018-04-28 DIAGNOSIS — M79601 Pain in right arm: Secondary | ICD-10-CM

## 2018-04-28 DIAGNOSIS — M779 Enthesopathy, unspecified: Secondary | ICD-10-CM

## 2018-04-28 DIAGNOSIS — M778 Other enthesopathies, not elsewhere classified: Secondary | ICD-10-CM

## 2018-04-28 NOTE — Progress Notes (Signed)
HPI  CC: Right arm pain  Peggy Obrien is a 48 year old female presents for right arm pain.  She states the pain is been getting worse around 3 weeks.  She states the pain is located over the tip of her elbow and shoots up into her arm over the posterior aspect.  She states she started noticing when she started doing more extensive tricep workouts.  She states she also started doing a backhand slice, which involves the tricep heavily.  She states she is been taken Advil 600 mg before she plays tennis, with seems to be helping.  She is also been doing home exercises, but is taking the weight up to around 10 pounds per exercise.  She states that she is noticing worsening pain in her biceps since she has done this.  She denies any numbness or tingling down her arm.  She denies any weakness of the arm.  She continues to play tennis.  She states she stopped doing tricep workouts last Monday, which seems to have helped the pain somewhat.  She has no history of trauma to the area.  See HPI and/or previous note for associated ROS.  Objective: BP 112/70   Ht 5\' 6"  (1.676 m)   Wt 131 lb (59.4 kg)   BMI 21.14 kg/m  Gen: Right-Hand Dominant. NAD, well groomed, a/o x3, normal affect.  CV: Well-perfused. Warm.  Resp: Non-labored.  Neuro: Sensation intact throughout. No gross coordination deficits.  Gait: Nonpathologic posture, unremarkable stride without signs of limp or balance issues.  Right elbow exam: No erythema, warmth, swelling noted.  Tenderness palpation over the superior edge of the olecranon.  No tenderness palpation over the lateral or medial epicondyle.  Full range of motion of flexion-extension at the elbow.  Full range of motion in flexion extension of the wrist.  No pain with resisted flexion or extension of the wrist.  Strength 5 5 throughout testing.  Negative UCL milk test.  Right shoulder exam: No erythema, warmth, swelling noted.  Trace palpation of the bicipital groove.  Full range of  motion in forward flexion, abduction, internal and external rotation.  Strength out of 5 throughout testing.  Negative empty can, negative speeds test, negative Hawkin's test, negative belly press off test, negative crossover test.  ULTRASOUND: Triceps, right Diagnostic limited ultrasound imaging obtained of patient's right tricep. -Triceps tendon visualized in long and short axis at the insertion site of the olecranon.  There is no appear to be any hypoechoic changes along the tendon at the insertion site.  There appears to be a collection of fluid deep to the tendon, likely a bursitis.  There are no tears obvious on scan. IMPRESSION: findings consistent with triceps tendinopathy.  Assessment and plan: 1. Triceps tendinopathy, with associated bursa seen on ultrasound. 2.  Biceps tendinopathy  We discussed treatment options at today's visit.  At this time we will get her fitted for a compression sleeve.  She should wear this during workouts and tennis.  We will have her start on some home exercises with eccentric tricep exercises.  We have advised her to lower the weight she is doing during her rehabilitation exercises, as this is likely making the pain in her biceps tendon worse.  She can take anti-inflammatories as needed.  We will see her back for follow-up as needed.  Alric QuanBlake Evoleth Nordmeyer, MD [facility] Sports Medicine Fellow 04/28/2018 4:14 PM  Patient seen and evaluated with the sports medicine fellow.  I agree with the above plan of  care.  Treatment as above and follow-up for ongoing or recalcitrant issues.

## 2018-04-29 ENCOUNTER — Encounter: Payer: Self-pay | Admitting: Sports Medicine

## 2018-04-30 ENCOUNTER — Other Ambulatory Visit: Payer: Self-pay | Admitting: Obstetrics and Gynecology

## 2018-04-30 DIAGNOSIS — R9389 Abnormal findings on diagnostic imaging of other specified body structures: Secondary | ICD-10-CM

## 2018-05-06 ENCOUNTER — Other Ambulatory Visit: Payer: Self-pay | Admitting: Obstetrics and Gynecology

## 2018-05-06 ENCOUNTER — Ambulatory Visit
Admission: RE | Admit: 2018-05-06 | Discharge: 2018-05-06 | Disposition: A | Discharge status: Home / Self Care | Payer: 59 | Source: Ambulatory Visit | Attending: Obstetrics and Gynecology | Admitting: Obstetrics and Gynecology

## 2018-05-06 ENCOUNTER — Ambulatory Visit: Payer: 59

## 2018-05-06 DIAGNOSIS — R9389 Abnormal findings on diagnostic imaging of other specified body structures: Secondary | ICD-10-CM

## 2018-05-06 MED ORDER — GADOBUTROL 1 MMOL/ML IV SOLN
7.0000 mL | Freq: Once | INTRAVENOUS | Status: AC | PRN
  Administered 2018-05-06: 7 mL via INTRAVENOUS

## 2018-10-05 ENCOUNTER — Other Ambulatory Visit: Payer: Self-pay | Admitting: Obstetrics and Gynecology

## 2018-10-05 DIAGNOSIS — R922 Inconclusive mammogram: Secondary | ICD-10-CM

## 2018-10-14 ENCOUNTER — Ambulatory Visit: Payer: 59

## 2018-11-18 ENCOUNTER — Other Ambulatory Visit: Payer: Self-pay

## 2018-11-18 ENCOUNTER — Ambulatory Visit
Admission: RE | Admit: 2018-11-18 | Discharge: 2018-11-18 | Disposition: A | Discharge status: Home / Self Care | Payer: 59 | Source: Ambulatory Visit | Attending: Obstetrics and Gynecology | Admitting: Obstetrics and Gynecology

## 2018-11-18 DIAGNOSIS — R922 Inconclusive mammogram: Secondary | ICD-10-CM

## 2018-11-18 MED ORDER — GADOBUTROL 1 MMOL/ML IV SOLN
6.0000 mL | Freq: Once | INTRAVENOUS | Status: AC | PRN
  Administered 2018-11-18: 6 mL via INTRAVENOUS

## 2018-11-20 ENCOUNTER — Other Ambulatory Visit: Payer: Self-pay | Admitting: Obstetrics and Gynecology

## 2018-11-20 DIAGNOSIS — R9389 Abnormal findings on diagnostic imaging of other specified body structures: Secondary | ICD-10-CM

## 2018-11-20 DIAGNOSIS — N63 Unspecified lump in unspecified breast: Secondary | ICD-10-CM

## 2018-11-25 ENCOUNTER — Other Ambulatory Visit: Payer: Self-pay | Admitting: Obstetrics and Gynecology

## 2018-11-25 DIAGNOSIS — N6310 Unspecified lump in the right breast, unspecified quadrant: Secondary | ICD-10-CM

## 2018-11-25 DIAGNOSIS — Z1231 Encounter for screening mammogram for malignant neoplasm of breast: Secondary | ICD-10-CM

## 2018-11-25 DIAGNOSIS — R9389 Abnormal findings on diagnostic imaging of other specified body structures: Secondary | ICD-10-CM

## 2018-11-26 ENCOUNTER — Ambulatory Visit
Admission: RE | Admit: 2018-11-26 | Discharge: 2018-11-26 | Disposition: A | Discharge status: Home / Self Care | Payer: 59 | Source: Ambulatory Visit | Attending: Obstetrics and Gynecology | Admitting: Obstetrics and Gynecology

## 2018-11-26 ENCOUNTER — Other Ambulatory Visit: Payer: 59

## 2018-11-26 ENCOUNTER — Other Ambulatory Visit: Payer: Self-pay

## 2018-11-26 ENCOUNTER — Ambulatory Visit: Admission: RE | Admit: 2018-11-26 | Payer: 59 | Source: Ambulatory Visit

## 2018-11-26 DIAGNOSIS — N6310 Unspecified lump in the right breast, unspecified quadrant: Secondary | ICD-10-CM

## 2018-11-26 DIAGNOSIS — Z1231 Encounter for screening mammogram for malignant neoplasm of breast: Secondary | ICD-10-CM

## 2018-11-26 DIAGNOSIS — R9389 Abnormal findings on diagnostic imaging of other specified body structures: Secondary | ICD-10-CM

## 2018-11-26 DIAGNOSIS — N63 Unspecified lump in unspecified breast: Secondary | ICD-10-CM

## 2018-12-07 ENCOUNTER — Other Ambulatory Visit: Payer: Self-pay | Admitting: Obstetrics and Gynecology

## 2018-12-07 DIAGNOSIS — R9389 Abnormal findings on diagnostic imaging of other specified body structures: Secondary | ICD-10-CM

## 2018-12-16 ENCOUNTER — Ambulatory Visit
Admission: RE | Admit: 2018-12-16 | Discharge: 2018-12-16 | Disposition: A | Discharge status: Home / Self Care | Payer: 59 | Source: Ambulatory Visit | Attending: Obstetrics and Gynecology | Admitting: Obstetrics and Gynecology

## 2018-12-16 ENCOUNTER — Other Ambulatory Visit: Payer: Self-pay

## 2018-12-16 ENCOUNTER — Other Ambulatory Visit: Payer: Self-pay | Admitting: Obstetrics and Gynecology

## 2018-12-16 ENCOUNTER — Ambulatory Visit: Payer: 59

## 2018-12-16 DIAGNOSIS — R9389 Abnormal findings on diagnostic imaging of other specified body structures: Secondary | ICD-10-CM

## 2018-12-16 MED ORDER — GADOBUTROL 1 MMOL/ML IV SOLN
6.0000 mL | Freq: Once | INTRAVENOUS | Status: AC | PRN
  Administered 2018-12-16: 6 mL via INTRAVENOUS

## 2019-01-27 LAB — HM DEXA SCAN

## 2019-03-02 HISTORY — PX: COLONOSCOPY W/ POLYPECTOMY: SHX1380

## 2019-03-03 DIAGNOSIS — Z86010 Personal history of colonic polyps: Secondary | ICD-10-CM | POA: Insufficient documentation

## 2019-03-03 LAB — HM COLONOSCOPY

## 2019-05-24 ENCOUNTER — Other Ambulatory Visit: Payer: Self-pay | Admitting: Obstetrics and Gynecology

## 2019-05-24 DIAGNOSIS — N63 Unspecified lump in unspecified breast: Secondary | ICD-10-CM

## 2019-07-27 ENCOUNTER — Other Ambulatory Visit: Payer: Self-pay

## 2019-07-27 ENCOUNTER — Ambulatory Visit
Admission: RE | Admit: 2019-07-27 | Discharge: 2019-07-27 | Disposition: A | Discharge status: Home / Self Care | Payer: 59 | Source: Ambulatory Visit | Attending: Obstetrics and Gynecology | Admitting: Obstetrics and Gynecology

## 2019-07-27 DIAGNOSIS — N63 Unspecified lump in unspecified breast: Secondary | ICD-10-CM

## 2019-07-27 MED ORDER — GADOBUTROL 1 MMOL/ML IV SOLN
6.0000 mL | Freq: Once | INTRAVENOUS | Status: AC | PRN
  Administered 2019-07-27: 11:00:00 6 mL via INTRAVENOUS

## 2019-08-24 ENCOUNTER — Ambulatory Visit (INDEPENDENT_AMBULATORY_CARE_PROVIDER_SITE_OTHER): Payer: 59 | Admitting: Sports Medicine

## 2019-08-24 ENCOUNTER — Other Ambulatory Visit: Payer: Self-pay

## 2019-08-24 DIAGNOSIS — M76821 Posterior tibial tendinitis, right leg: Secondary | ICD-10-CM | POA: Diagnosis not present

## 2019-08-24 NOTE — Patient Instructions (Signed)
Posterior Tibial Tendinitis Posterior tibial tendinitis is irritation of a tendon called the posterior tibial tendon. Your posterior tibial tendon is a cord-like tissue that connects bones of your lower leg and foot to a muscle that:  Supports your arch.  Helps you raise up on your toes.  Helps you turn your foot down and in. This condition causes foot and ankle pain. It can also lead to a flat foot. What are the causes? This condition is most often caused by repeated stress to the tendon (overuse injury). It can also be caused by a sudden injury that stresses the tendon, such as landing on your foot after jumping or falling. What increases the risk? This condition is more likely to develop in:  People who play a sport that involves putting a lot of pressure on the feet, such as: ? Basketball. ? Tennis. ? Soccer. ? Hockey.  Runners.  Females who are older than 51 years of age and are overweight.  People with diabetes.  People with decreased foot stability.  People with flat feet. What are the signs or symptoms? Symptoms include:  Pain in the inner ankle.  Pain at the arch of your foot.  Pain that gets worse with running, walking, or standing.  Swelling on the inside of your ankle and foot.  Weakness in your ankle or foot.  Inability to stand up on tiptoe.  Flattening of the arch of your foot. How is this diagnosed? This condition may be diagnosed based on:  Your symptoms.  Your medical history.  A physical exam.  Tests, such as: ? X-ray. ? MRI. ? Ultrasound. How is this treated? This condition may be treated by:  Putting ice to the injured area.  Taking NSAIDs, such as ibuprofen, to reduce pain and swelling.  Wearing a special shoe or shoe insert to support your arch (orthotic).  Having physical therapy.  Replacing high-impact exercise with low-impact exercise, such as swimming or cycling. If your symptoms do not improve with these treatments, you  may need to wear a splint, removable walking boot, or short leg cast for 6-8 weeks to keep your foot and ankle still (immobilized). Follow these instructions at home: If you have a cast, splint, or boot:  Keep it clean and dry.  Check the skin around it every day. Tell your health care provider about any concerns. If you have a cast:  Do not stick anything inside it to scratch your skin. Doing that increases your risk of infection.  You may put lotion on dry skin around the edges of the cast. Do not put lotion on the skin underneath the cast. If you have a splint or boot:  Wear it as told by your health care provider. Remove it only as told by your health care provider.  Loosen it if your toes tingle, become numb, or turn cold and blue. Bathing  Do not take baths, swim, or use a hot tub until your health care provider approves. Ask your health care provider if you may take showers.  If your cast, splint, or boot is not waterproof: ? Do not let it get wet. ? Cover it with a waterproof covering while you take a bath or a shower. Managing pain and swelling   If directed, put ice on the injured area. ? If you have a removable splint or boot, remove it as told by your health care provider. ? Put ice in a plastic bag. ? Place a towel between your skin and  the bag or between your cast and the bag. ? Leave the ice on for 20 minutes, 2-3 times a day.  Move your toes often to reduce stiffness and swelling.  Raise (elevate) the injured area above the level of your heart while you are sitting or lying down. Activity  Do not use the injured foot to support your body weight until your health care provider says that you can. Use crutches as told by your health care provider.  Do not do activities that make pain or swelling worse.  Ask your health care provider when it is safe to drive if you have a cast, splint, or boot on your foot.  Return to your normal activities as told by your  health care provider. Ask your health care provider what activities are safe for you.  Do exercises as told by your health care provider. General instructions  Take over-the-counter and prescription medicines only as told by your health care provider.  If you have an orthotic, use it as told by your health care provider.  Keep all follow-up visits as told by your health care provider. This is important. How is this prevented?  Wear footwear that is appropriate to your athletic activity.  Avoid athletic activities that cause pain or swelling in your ankle or foot.  Before being active, do range-of-motion and stretching exercises.  If you develop pain or swelling while training, stop training.  If you have pain or swelling that does not improve after a few days of rest, see your health care provider.  If you start a new athletic activity, start gradually so you can build up your strength and flexibility. Contact a health care provider if:  Your symptoms get worse.  Your symptoms do not improve in 6-8 weeks.  You develop new, unexplained symptoms.  Your splint, boot, or cast gets damaged. Summary  Posterior tibial tendinitis is irritation of a tendon called the posterior tibial tendon.  This condition is most often caused by repeated stress to the tendon (overuse injury).  This condition causes foot pain and ankle pain. It can also lead to a flat foot.  This condition may be treated by not doing high-impact activities, applying ice, having physical therapy, wearing orthotics, and wearing a cast, splint, or boot if needed. This information is not intended to replace advice given to you by your health care provider. Make sure you discuss any questions you have with your health care provider. Document Revised: 07/07/2018 Document Reviewed: 05/14/2018 Elsevier Patient Education  2020 [employer]   1. Please continue the ankle exercises we discussed  2. Please use ice daily   3. Please use the body helix ankle sleeves to help with stability  4. F/u in 4-6 weeks with Dr. Margaretha Sheffield

## 2019-08-24 NOTE — Assessment & Plan Note (Addendum)
Patient with pain to palpation of posterior tibial tendon. US showing fluid around tendon as well confirming tendinopathy. Advised continued ice and use of body helix for stability. Advised to continue ankle exercises. Patient can play tennis but only if pain <2, does not increase, and no limping. AVS with information on posterior tibial tendinitis provided for patient. Patient to f/u in 4-6 weeks discussed with Dr. Margaretha Sheffield

## 2019-08-24 NOTE — Progress Notes (Signed)
° °  Peggy Obrien is a 49 y.o. female who presents to [facility] today for the following:  Right ankle pain Patient presenting for right ankle pain which began in January. 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 States that pain can come and go. States that she has been using ankle sleeves for stability. Also notes she used to exercise daily with pilates but has decreased exercise 2/2 pain which help. Also notes taking advil which helps. Recently started to increase   PMH reviewed. Quadriceps muscle strain, left ROS as above. Medications reviewed.  Exam:  BP 110/68    Ht 5\' 6"  (1.676 m)    Wt 131 lb (59.4 kg)    BMI 21.14 kg/m  Gen: Well NAD MSK: Ankle: - Inspection: No obvious deformity, erythema, swelling, or ecchymosis, ulcers, calluses, blisters - Palpation: No TTP at MT heads, no TTP at base of 5th MT, no TTP over cuboid, no tenderness over navicular prominence, no TTP over lateral or medial malleolus.  No sign of peroneal tendon subluxation or TTP. TTP posterior to medial malleolus  - Strength: Normal strength with dorsiflexion, plantarflexion, inversion, and eversion of foot; flexion and extension of toes - ROM: Full ROM - Neuro/vasc: NV intact - Special Tests: Negative anterior drawer, normal inversion test.  Normal calcaneal inversion. Neg too many toes sign. No pain with resisted flexion of great toe   Feet: no deformity noted.  Normal arch w/I pes cavus or planus, normal arch flexibility.  Normal calcaneal motion with toe-raise.  :  Fluid surrounding posterior tibial tendon.    Assessment and Plan: 1) Posterior tibial tendinitis Patient with pain to palpation of posterior tibial tendon. US showing fluid around tendon as well confirming tendinopathy. Advised continued ice and use of body helix for stability. Advised to continue ankle exercises. Patient can play tennis but only if pain <2, does not increase, and no limping. AVS with information on  posterior tibial tendinitis provided for patient. Patient to f/u in 4-6 weeks discussed with Dr. [place], DO, PGY-3 [facility] Resident  08/24/2019 3:47 PM  Patient seen and evaluated with the resident.  I agree with the above plan of care.  Limited ultrasound of the medial right ankle shows fluid around the posterior tibialis tendon.  Exam shows a well preserved longitudinal arch and a competent posterior tibialis tendon.  Treatment as above.  She would like to try body helix compression sleeve in lieu of her current compression sleeve.  She will also avoid exercises involving repetitive dorsiflexion and plantar flexion of the ankle.  We could consider scaphoid pads for arch support and formal physical therapy in the future if her symptoms persist.  Follow-up with me in 4 to 6 weeks.

## 2019-09-28 ENCOUNTER — Other Ambulatory Visit: Payer: Self-pay

## 2019-09-28 ENCOUNTER — Ambulatory Visit (INDEPENDENT_AMBULATORY_CARE_PROVIDER_SITE_OTHER): Payer: 59 | Admitting: Sports Medicine

## 2019-09-28 VITALS — BP 108/66 | Ht 66.0 in | Wt 131.0 lb

## 2019-09-28 DIAGNOSIS — M76821 Posterior tibial tendinitis, right leg: Secondary | ICD-10-CM

## 2019-09-28 NOTE — Progress Notes (Signed)
   Subjective:    Patient ID: Peggy Obrien, female    DOB: [birth year], 49 y.o.   MRN: [id number]  HPI   Patient comes in today for follow-up on right ankle posterior tibialis tendinitis/tendinopathy.  Overall, she is doing very well.  She did have a slight setback yesterday.  No swelling.  She has found the body helix compression sleeve to be comfortable and is planning on ordering additional sleeves.    Review of Systems As above    Objective:   Physical Exam  Well-developed, well-nourished.  No acute distress.  Awake alert and oriented x3.  Vital signs reviewed  Right ankle: Full range of motion.  No soft tissue swelling.  No effusion.  Slight tenderness to palpation along the posterior tibialis tendon.  Good pulses.  Walking without a limp.      Assessment & Plan:   Improving right ankle posterior tibialis tendinitis/tendinopathy  Patient will continue wearing her body helix compression sleeves when playing tennis.  I instructed her in a single eccentric strengthening exercise with instructions to start doing this 3 times a week.  She may continue with activity as tolerated.  Given her overall improvement I am going to hold on other treatment such as scaphoid pads or physical therapy at this time.  Patient will follow up with me for ongoing or recalcitrant issues.

## 2019-09-29 ENCOUNTER — Other Ambulatory Visit: Payer: Self-pay | Admitting: Obstetrics and Gynecology

## 2019-09-29 DIAGNOSIS — Z1231 Encounter for screening mammogram for malignant neoplasm of breast: Secondary | ICD-10-CM

## 2019-12-03 ENCOUNTER — Other Ambulatory Visit: Payer: Self-pay

## 2019-12-03 ENCOUNTER — Ambulatory Visit
Admission: RE | Admit: 2019-12-03 | Discharge: 2019-12-03 | Disposition: A | Discharge status: Home / Self Care | Payer: 59 | Source: Ambulatory Visit | Attending: Obstetrics and Gynecology | Admitting: Obstetrics and Gynecology

## 2019-12-03 DIAGNOSIS — Z1231 Encounter for screening mammogram for malignant neoplasm of breast: Secondary | ICD-10-CM

## 2020-03-08 LAB — LIPID PANEL
Cholesterol: 166 (ref 0–200)
Cholesterol: 166 (ref 0–200)
HDL: 83 — AB (ref 35–70)
HDL: 83 — AB (ref 35–70)
LDL Cholesterol: 73
LDL Cholesterol: 73
LDl/HDL Ratio: 2
LDl/HDL Ratio: 2
Triglycerides: 46 (ref 40–160)
Triglycerides: 46 (ref 40–160)

## 2020-03-08 LAB — COMPREHENSIVE METABOLIC PANEL
Albumin: 4.3 (ref 3.5–5.0)
Calcium: 9.3 (ref 8.7–10.7)

## 2020-03-08 LAB — HEPATIC FUNCTION PANEL
ALT: 16 (ref 7–35)
ALT: 16 U/L (ref 7–35)
AST: 18 (ref 13–35)
AST: 18 (ref 13–35)
Alkaline Phosphatase: 55 (ref 25–125)
Alkaline Phosphatase: 55 (ref 25–125)
Bilirubin, Total: 0.5
Bilirubin, Total: 0.5

## 2020-03-08 LAB — BASIC METABOLIC PANEL
BUN: 18 (ref 4–21)
BUN: 18 (ref 4–21)
CO2: 30 — AB (ref 13–22)
CO2: 30 — AB (ref 13–22)
Chloride: 102 (ref 99–108)
Chloride: 102 (ref 99–108)
Creatinine: 0.7 (ref 0.5–1.1)
Creatinine: 0.7 (ref 0.5–1.1)
Glucose: 91
Glucose: 91
Potassium: 4 (ref 3.4–5.3)
Potassium: 4 mEq/L (ref 3.5–5.1)
Sodium: 141 (ref 137–147)
Sodium: 141 (ref 137–147)

## 2020-03-08 LAB — TSH
TSH: 4.35 (ref 0.41–5.90)
TSH: 4.35 (ref 0.41–5.90)

## 2020-03-08 LAB — COMPREHENSIVE METABOLIC PANEL WITH GFR
Albumin: 4.3 (ref 3.5–5.0)
Calcium: 9.3 (ref 8.7–10.7)
eGFR: 96

## 2020-07-11 ENCOUNTER — Ambulatory Visit (INDEPENDENT_AMBULATORY_CARE_PROVIDER_SITE_OTHER): Payer: 59 | Admitting: Sports Medicine

## 2020-07-11 ENCOUNTER — Other Ambulatory Visit: Payer: Self-pay

## 2020-07-11 DIAGNOSIS — M5431 Sciatica, right side: Secondary | ICD-10-CM

## 2020-07-11 DIAGNOSIS — M5430 Sciatica, unspecified side: Secondary | ICD-10-CM | POA: Insufficient documentation

## 2020-07-11 MED ORDER — MELOXICAM 15 MG PO TABS: ORAL_TABLET | ORAL | 0 refills | Status: DC

## 2020-07-11 NOTE — Progress Notes (Signed)
    SUBJECTIVE:   CHIEF COMPLAINT / HPI:   Right Leg Pain Began last Thursday after playing tennis. She has been playing a lot recently and began having pain radiating down the right leg past the knee to the ankle. Feels as a discomfort with no numbness and tingling but perceived weakness is present. She has a history of low back pain and has been using heating pad, occasional OTC Nsaid, Mackenzie exercises, and an inversion table with moderate relief. Sitting down especially while driving recreates the pain. Also uses Arnica gel with some temporary relief. No loss of bowel or bladder function, no loss of balance, no falls, no trauma.  PERTINENT  PMH / PSH: Hx of Sciatica, hx of low back pain  OBJECTIVE:   BP 123/73   Ht 5\' 6"  (1.676 m)   Wt 131 lb (59.4 kg)   BMI 21.14 kg/m   [facility]  Frequency of aerobic exercise (# of days/week) 3  Average time in minutes 90  Frequency of strengthening activities (# of days/week) 3   Lumbar spine:  - Inspection: no gross deformity or asymmetry, swelling or ecchymosis. No skin changes - Palpation: No TTP over the spinous processes, paraspinal muscles, or SI joints b/l - ROM: full active ROM of the lumbar spine in flexion and extension without pain - Strength: 5/5 strength of lower extremity in L4-S1 nerve root distributions b/l; Weakness of great toe extension on right compared to left. Weakness of hip abductors and pain with resistance to knee flexion in the hamstring. - Neuro: sensation intact in the L4-S1 nerve root distribution b/l, L4 and S1 reflexes equal and symmetric bilaterally Special Tests:  - Straight Leg Raise test: NEG  - Slump test: NEG  - FADIR and FABER: NEG   ASSESSMENT/PLAN:   Sciatica Right leg pain consistent with L5 lumbar radiculopathy. - Repeat physical therapy with 07/13/2020 at [facility] - Meloxicam 15mg  once per day with food for one week then as needed - f/u in 4  weeks     [facility], DO PGY-4, Sports Medicine Fellow [facility]  Patient seen and evaluated with the sports medicine fellow.  I agree with the above plan of care.  Physical exam does show some weakness with resisted great toe extension on the right consistent with probable L5 nerve root compression or irritation.  Follow-up in 4 weeks.  If symptoms persist or worsen we may need to consider imaging at that time.  Call with questions or concerns in the interim.

## 2020-07-11 NOTE — Assessment & Plan Note (Signed)
Right leg pain consistent with L5 lumbar radiculopathy. - Repeat physical therapy with Dietrich Pates at [facility] - Meloxicam 15mg  once per day with food for one week then as needed - f/u in 4 weeks

## 2020-07-11 NOTE — Patient Instructions (Signed)
It was great to meet you today! Thank you for letting me participate in your care!  Today, we discussed your right leg pain and you do have symptoms of sciatica but also some symptoms consistent with a mild hamstring strain and as well as weak hip abductors. I do think formal physical therapy along with meloxicam to take once per day with food for the next 10-14 days would be helpful. Avoid aggravating activities until we see you back in four weeks to check on your progress.  Be well, Jules Schick, DO PGY-4, Sports Medicine Fellow [facility]

## 2020-08-08 ENCOUNTER — Other Ambulatory Visit: Payer: Self-pay | Admitting: Sports Medicine

## 2020-08-10 ENCOUNTER — Ambulatory Visit (INDEPENDENT_AMBULATORY_CARE_PROVIDER_SITE_OTHER): Payer: 59 | Admitting: Sports Medicine

## 2020-08-10 ENCOUNTER — Other Ambulatory Visit: Payer: Self-pay

## 2020-08-10 VITALS — BP 122/79 | Ht 66.0 in | Wt 131.0 lb

## 2020-08-10 DIAGNOSIS — M5431 Sciatica, right side: Secondary | ICD-10-CM

## 2020-08-10 DIAGNOSIS — M25511 Pain in right shoulder: Secondary | ICD-10-CM

## 2020-08-10 NOTE — Progress Notes (Signed)
   Subjective:    Patient ID: Peggy Obrien, female    DOB: 1970-05-04, 50 y.o.   MRN: [id number]  HPI   Peggy Obrien comes in today for follow-up on her lumbar radiculopathy.  She is doing much better.  She took meloxicam for 7 days and has had 2 physical therapy visits.  She is feeling good.  Main complaint today is some new onset anterior right shoulder pain.  Pain began without any trauma.  She has another physical therapy visit tomorrow and is requesting that her therapist work on her shoulder at that visit if possible.    Review of Systems    As above Objective:   Physical Exam  Developed, well nourished.  No acute distress  Right shoulder: Full range of motion.  No tenderness over the acromioclavicular joint.  There is tenderness to palpation over the long head of the biceps tendon in the bicipital groove.  Negative speeds, negative Yergason's.  She does have some pain with empty can.  Rotator cuff strength is 5/5.  Good pulses distally.  Neurological exam: The weakness that patient had with resisted great toe extension on the right previously has now resolved.  Her strength is 5/5 in both lower extremities.        Assessment & Plan:   Resolved right leg lumbar radiculopathy New onset right shoulder pain likely secondary to proximal biceps tendinitis  I have updated the patient's PT prescription to include treatment for her right shoulder.  I think she will quickly graduate to home exercise programs for both her lumbar spine and her right shoulder.  She may resume all activity including tennis as tolerated.  Follow-up for ongoing or recalcitrant issues.

## 2020-08-23 ENCOUNTER — Other Ambulatory Visit: Payer: Self-pay | Admitting: Obstetrics and Gynecology

## 2020-08-23 DIAGNOSIS — Z803 Family history of malignant neoplasm of breast: Secondary | ICD-10-CM

## 2020-08-30 ENCOUNTER — Ambulatory Visit
Admission: RE | Admit: 2020-08-30 | Discharge: 2020-08-30 | Disposition: A | Discharge status: Home / Self Care | Payer: 59 | Source: Ambulatory Visit | Attending: Obstetrics and Gynecology | Admitting: Obstetrics and Gynecology

## 2020-08-30 ENCOUNTER — Other Ambulatory Visit: Payer: Self-pay

## 2020-08-30 DIAGNOSIS — Z803 Family history of malignant neoplasm of breast: Secondary | ICD-10-CM

## 2020-08-30 MED ORDER — GADOBUTROL 1 MMOL/ML IV SOLN
5.0000 mL | Freq: Once | INTRAVENOUS | Status: AC | PRN
  Administered 2020-08-30: 5 mL via INTRAVENOUS

## 2020-11-15 ENCOUNTER — Other Ambulatory Visit: Payer: Self-pay | Admitting: Obstetrics and Gynecology

## 2020-11-15 DIAGNOSIS — Z1231 Encounter for screening mammogram for malignant neoplasm of breast: Secondary | ICD-10-CM

## 2020-12-08 ENCOUNTER — Ambulatory Visit
Admission: RE | Admit: 2020-12-08 | Discharge: 2020-12-08 | Disposition: A | Discharge status: Home / Self Care | Payer: 59 | Source: Ambulatory Visit | Attending: Obstetrics and Gynecology | Admitting: Obstetrics and Gynecology

## 2020-12-08 ENCOUNTER — Other Ambulatory Visit: Payer: Self-pay

## 2020-12-08 DIAGNOSIS — Z1231 Encounter for screening mammogram for malignant neoplasm of breast: Secondary | ICD-10-CM

## 2020-12-08 LAB — HM MAMMOGRAPHY

## 2021-01-29 LAB — HM PAP SMEAR: HM Pap smear: NORMAL

## 2021-01-29 LAB — RESULTS CONSOLE HPV: CHL HPV: NEGATIVE

## 2021-01-29 LAB — HM DEXA SCAN

## 2021-02-01 IMAGING — MR MR BREAST*R* WO/W CM
8 of 10 series · 35 of 48 positions shown · IV contrast (6ml gad)
Comparison: 11/18/2018, 04/23/2018 MRs

CLINICAL DATA: 47-year-old female returns for biopsy of 1 cm linear
non masslike enhancement within the RETROAREOLAR RIGHT breast. Six
months ago, the patient had 2 other indeterminate areas within the
RIGHT breast on MR which were not apparent when she returned for
temps of biopsy. The patient is asymptomatic and without nipple
discharge.

LABS:  6 mL Gadavist
EXAM:
MR OF THE RIGHT BREAST WITH AND WITHOUT CONTRAST
TECHNIQUE: Multiplanar, multisequence MR images of the right breast were
obtained prior to and following the intravenous administration of 6
ml of Gadavist.

[Series 3: fiducial unilateral · sagittal · 2.0mm · 1.33mm/px · 3 of 52 slices shown]
[im 1/52]
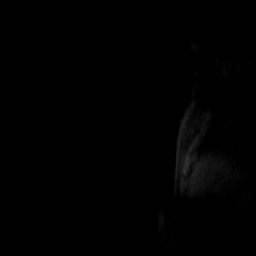
[im 26/52]
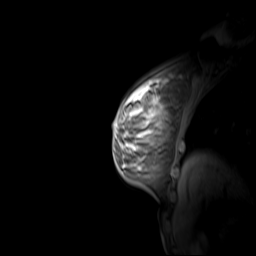
[im 52/52]
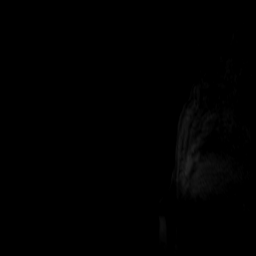

[Series 4: dynamic pre · axial · non-contrast · 1.3mm · 0.73mm/px · z∈[-92,+93]mm · 5 of 144 slices shown]
[im 1/144]
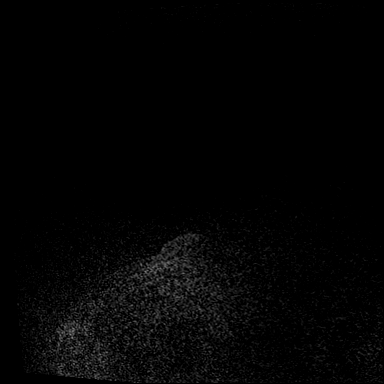
[im 36/144]
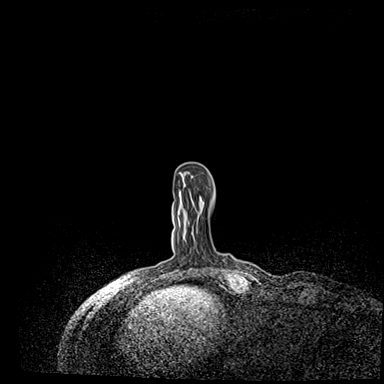
[im 72/144]
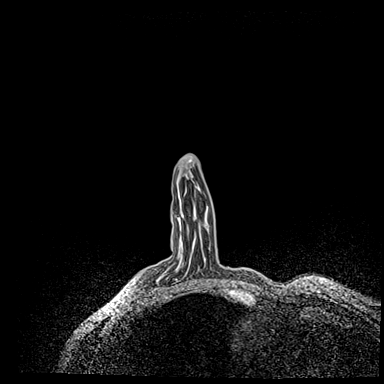
[im 108/144]
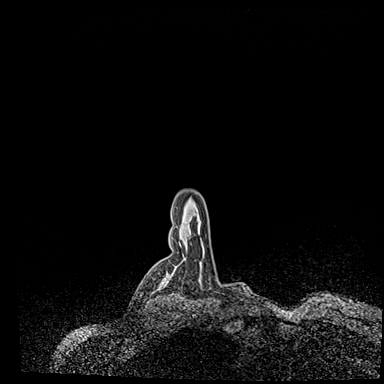
[im 144/144]
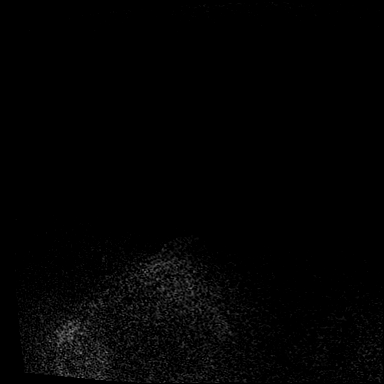

[Series 5: dynamic post 20 · axial · 1.3mm · 0.73mm/px · z∈[-92,+93]mm · 5 of 144 slices shown (1 of 2)]
[im 1/144]
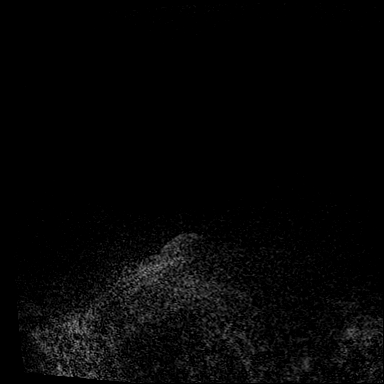
[im 36/144]
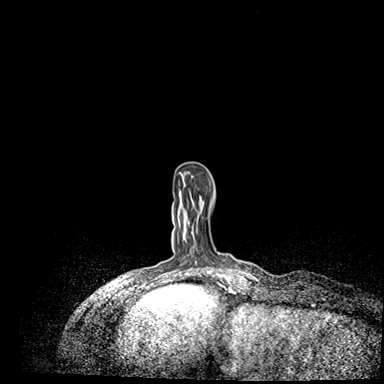
[im 72/144]
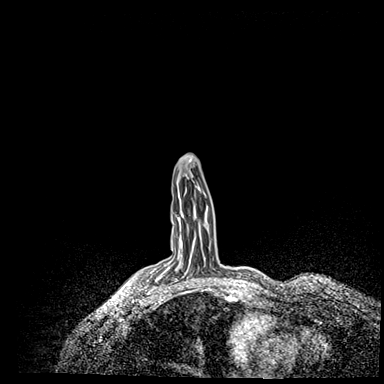
[im 108/144]
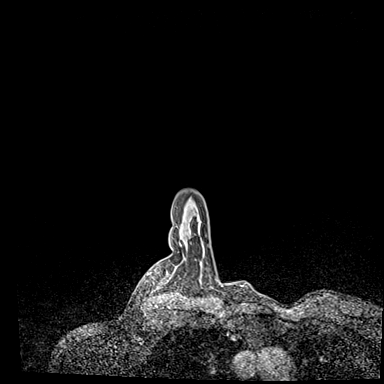
[im 144/144]
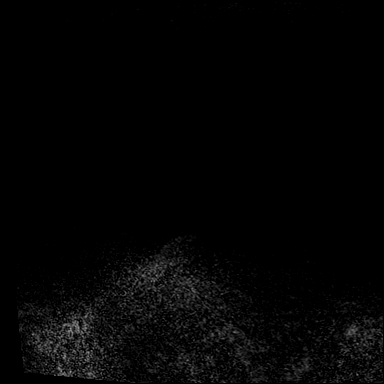

[Series 6: dynamic post 20 · axial · 1.3mm · 0.73mm/px · z∈[-92,+93]mm · 5 of 144 slices shown (2 of 2)]
[im 1/144]
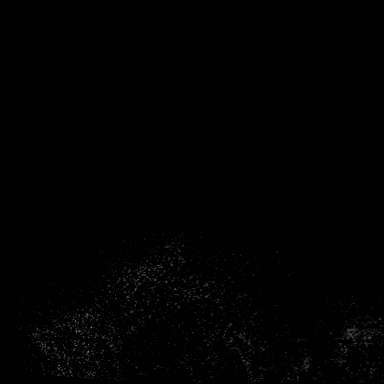
[im 36/144]
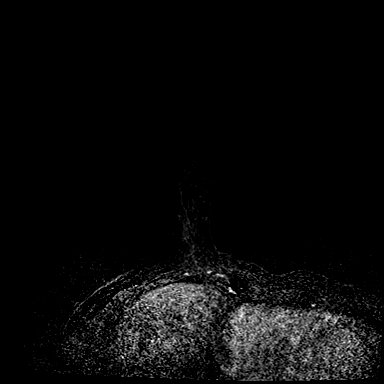
[im 72/144]
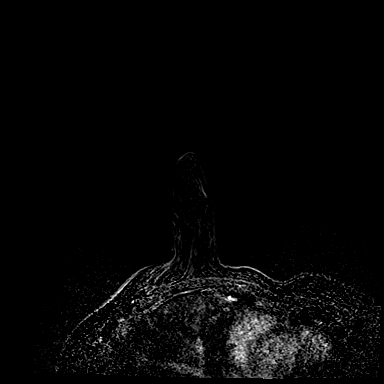
[im 108/144]
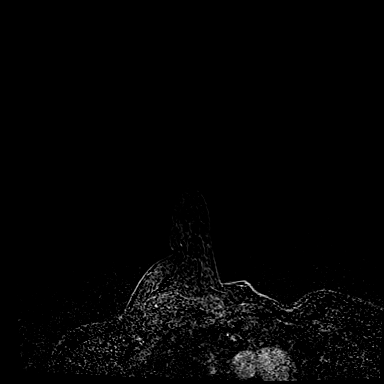
[im 144/144]
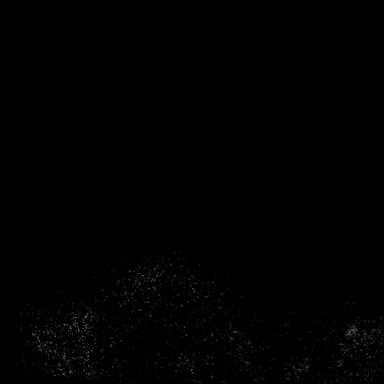

[Series 7: dynamic post 3 · axial · 1.3mm · 0.73mm/px · z∈[-92,+93]mm · 5 of 144 slices shown (1 of 2)]
[im 1/144]
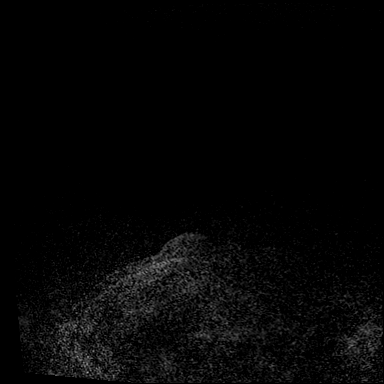
[im 36/144]
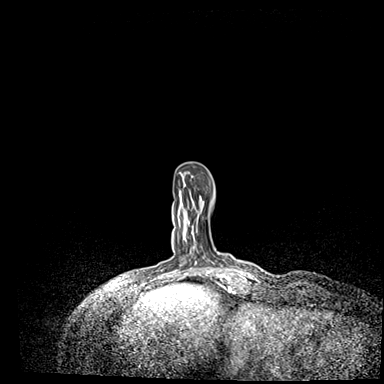
[im 72/144]
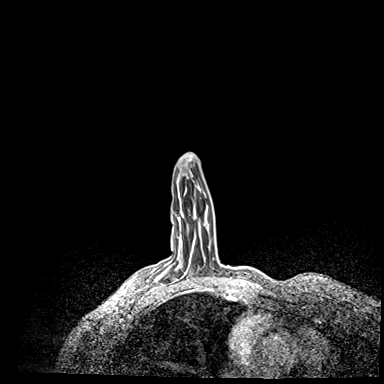
[im 108/144]
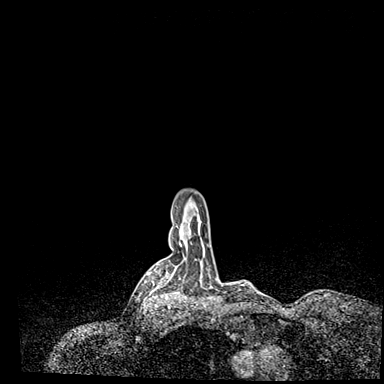
[im 144/144]
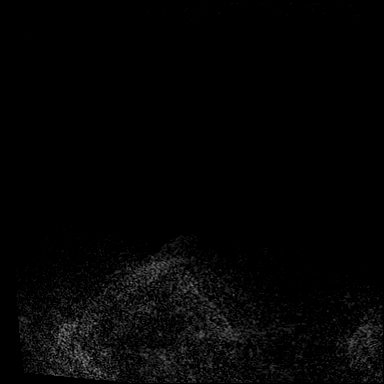

[Series 8: dynamic post 3 · axial · 1.3mm · 0.73mm/px · z∈[-92,+93]mm · 5 of 144 slices shown (2 of 2)]
[im 1/144]
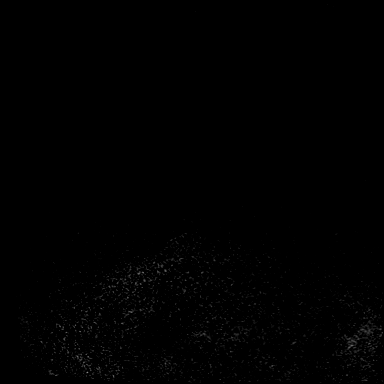
[im 36/144]
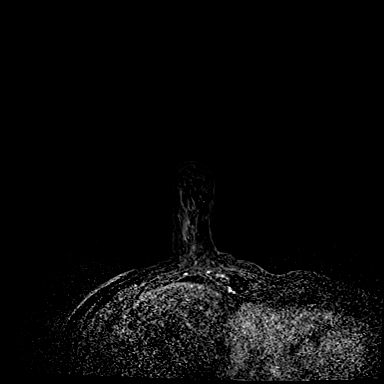
[im 72/144]
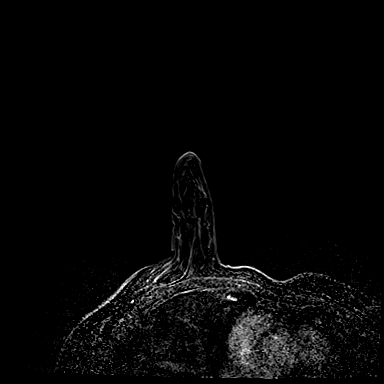
[im 108/144]
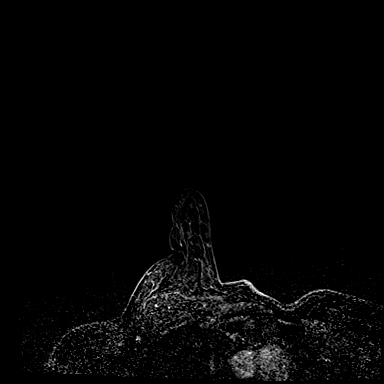
[im 144/144]
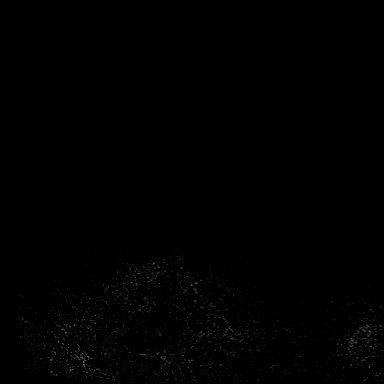

[Series 9: dynamic post 8 · axial · 1.3mm · 0.73mm/px · z∈[-92,+93]mm · 5 of 144 slices shown (1 of 2)]
[im 1/144]
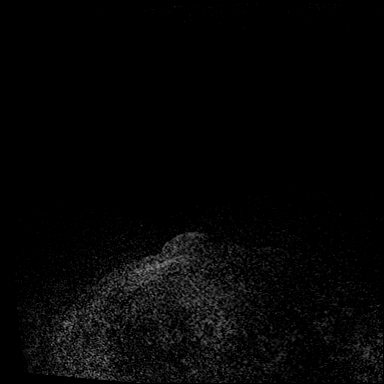
[im 36/144]
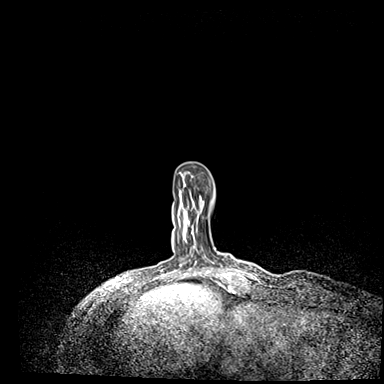
[im 72/144]
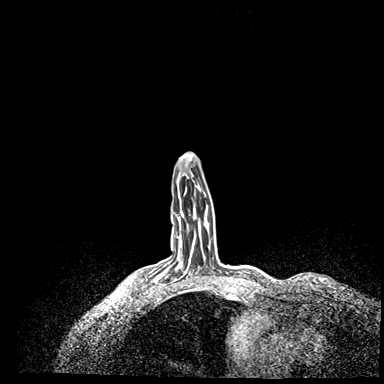
[im 108/144]
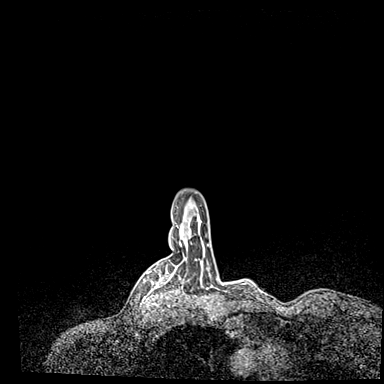
[im 144/144]
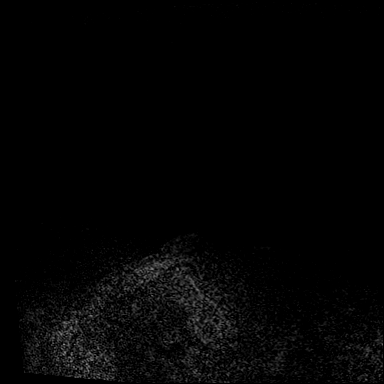

[Series 10: dynamic post 8 · axial · 1.3mm · 0.73mm/px · z∈[-92,-47]mm · 2 of 144 slices shown (2 of 2)]
[im 1/144]
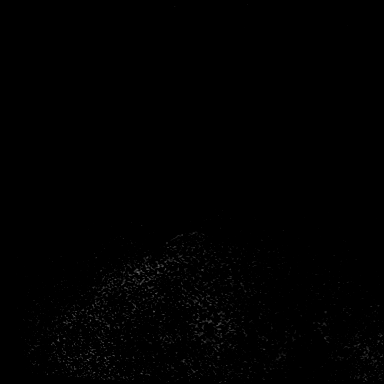
[im 36/144]
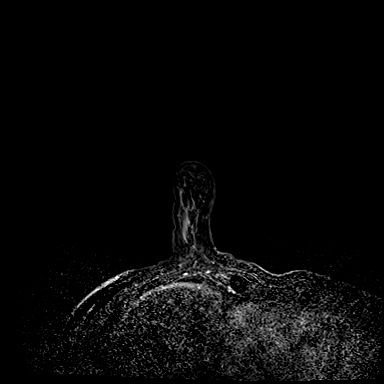

[35 of 48 positions shown; findings below may reference images not displayed]

Three-dimensional MR images were rendered by post-processing of the
original MR data on an independent workstation. The
three-dimensional MR images were interpreted, and findings are
reported in the following complete MRI report for this study. Three
dimensional images were evaluated at the independent [employer]
workstation
FINDINGS: Breast composition: b. Scattered fibroglandular tissue.

Background parenchymal enhancement: Mild

Right breast: No mass or abnormal enhancement. The 1 cm linear non
masslike enhancement in the RETROAREOLAR RIGHT breast is not
identified. Delayed postcontrast sequences were taken [DATE]
minutes.

Lymph nodes: No abnormal appearing lymph nodes.

Ancillary findings:  None.
IMPRESSION: 1 cm linear non masslike enhancement in the RETROAREOLAR RIGHT
breast identified on 11/18/2018 is not visualized on this
examination and the biopsy was not performed. Given appearance of
the non masslike enhancement and the positioning of the breast on
the 11/18/2018 study, it is felt that the abnormality most likely is
benign. MRI in 6 months is recommended to follow-up this area and
the other 2 RIGHT breast areas of non masslike enhancement
identified on prior studies.

RECOMMENDATION:
Bilateral breast MRI in 6 months. The findings were discussed with
the patient.

BI-RADS CATEGORY  3: Probably benign.

## 2021-04-05 LAB — TSH: TSH: 3.18 (ref 0.41–5.90)

## 2021-04-05 LAB — VITAMIN D 25 HYDROXY (VIT D DEFICIENCY, FRACTURES): Vit D, 25-Hydroxy: 43

## 2021-04-05 LAB — LIPID PANEL
Cholesterol: 180 (ref 0–200)
HDL: 88 — AB (ref 35–70)
LDL Cholesterol: 85
LDl/HDL Ratio: 2.1
Triglycerides: 36 — AB (ref 40–160)

## 2021-04-05 LAB — CBC: RBC: 4.59 (ref 3.87–5.11)

## 2021-04-05 LAB — HEPATIC FUNCTION PANEL
ALT: 26 (ref 7–35)
AST: 23 (ref 13–35)
Alkaline Phosphatase: 62 (ref 25–125)
Bilirubin, Total: 0.7

## 2021-04-05 LAB — COMPREHENSIVE METABOLIC PANEL
Albumin: 4.4 (ref 3.5–5.0)
Calcium: 9.3 (ref 8.7–10.7)
eGFR: 109

## 2021-04-05 LAB — BASIC METABOLIC PANEL
BUN: 14 (ref 4–21)
CO2: 34 — AB (ref 13–22)
Chloride: 102 (ref 99–108)
Creatinine: 0.6 (ref 0.5–1.1)
Glucose: 104
Potassium: 3.9 (ref 3.4–5.3)
Sodium: 141 (ref 137–147)

## 2021-04-05 LAB — CBC AND DIFFERENTIAL
HCT: 43 (ref 36–46)
Hemoglobin: 14.6 (ref 12.0–16.0)
Platelets: 268 (ref 150–399)
WBC: 5

## 2021-04-05 LAB — HEMOGLOBIN A1C: Hemoglobin A1C: 5.4

## 2021-05-22 ENCOUNTER — Other Ambulatory Visit: Payer: Self-pay

## 2021-05-22 DIAGNOSIS — E559 Vitamin D deficiency, unspecified: Secondary | ICD-10-CM | POA: Insufficient documentation

## 2021-05-23 ENCOUNTER — Other Ambulatory Visit: Payer: Self-pay

## 2021-05-23 ENCOUNTER — Encounter: Payer: Self-pay | Admitting: Family Medicine

## 2021-05-23 ENCOUNTER — Ambulatory Visit (INDEPENDENT_AMBULATORY_CARE_PROVIDER_SITE_OTHER): Payer: 59 | Admitting: Family Medicine

## 2021-05-23 VITALS — BP 108/74 | HR 73 | Temp 98.1°F | Ht 65.75 in | Wt 134.6 lb

## 2021-05-23 DIAGNOSIS — E878 Other disorders of electrolyte and fluid balance, not elsewhere classified: Secondary | ICD-10-CM

## 2021-05-23 NOTE — Progress Notes (Signed)
Office Note ?05/23/2021 ? ?CC:  ?Chief Complaint  ?Patient presents with  ? Establish Care  ?  Last CPE 04/05/21, shingrix series started 2021 and completed in 2022.   ? ?HPI:  ?Peggy Obrien is a 51 y.o. female who is here to establish care. ?Patient's most recent primary MD: [facility] in [place]. ?Old records were reviewed prior to or during today's visit. ? ?She got her CPE with [facility] in January of this year. ?All labs normal.  Cervical cancer screening and breast cancer screening all up-to-date with her GYN doctor. ?Last colonoscopy was 2020, polyp found, plans for repeat 2025. ? ?She has a long history of disequilibrium syndrome.  No definite diagnosis was ever obtained despite extensive work-up in [place] and then in [place].  She was tried on multiple medications but none helped.  Symptoms are minimal at this point. ? ?Past Medical History:  ?Diagnosis Date  ? BRCA gene positive   ? she gets breasts MRI as screening (per GYN)  ? Disequilibrium syndrome   ? many years, chronic lightheadedness with occ period of vertigo, extensive w/u in [place] and then in [place], many meds tried, no definite dx-->seemed to "even out" per pt  ? Dizziness 2011  ? chronic  ? History of adenomatous polyp of colon   ? recall 2025 Sadie Haber)  ? Menorrhagia   ? Lysteda as needed per GYN  ? Osteopenia   ? per GYN  ? ? ?Past Surgical History:  ?Procedure Laterality Date  ? COLONOSCOPY W/ POLYPECTOMY  2020  ? Dr. Earlean Shawl, recall 2025  ? DILATATION & CURETTAGE/HYSTEROSCOPY WITH MYOSURE N/A 02/02/2016  ? Procedure: DILATATION & CURETTAGE/HYSTEROSCOPY WITH MYOSURE;  Surgeon: Alden Hipp, MD;  Location: [facility];  Service: Gynecology;  Laterality: N/A;  ? EYE SURGERY  2000  ? lasix  ? LASER ABLATION OF THE CERVIX  1987  ? MYRINGOTOMY Bilateral 1973  ? POLYPECTOMY N/A 02/02/2016  ? Procedure: POLYPECTOMY;  Surgeon: Alden Hipp, MD;  Location: [facility];  Service: Gynecology;  Laterality: N/A;  ? [place]  ? ? ?Family History  ?Problem Relation Age of Onset  ? High Cholesterol Mother   ? Multiple myeloma Father   ? Heart disease Father   ? High blood pressure Father   ? Early death Brother   ? High blood pressure Brother   ? Breast cancer Maternal Grandmother   ? Early death Maternal Grandfather   ? Heart attack Maternal Grandfather   ? Diabetes Paternal Grandfather   ? ? ?Social History  ? ?Socioeconomic History  ? Marital status: Married  ?  Spouse name: Not on file  ? Number of children: Not on file  ? Years of education: Not on file  ? Highest education level: Not on file  ?Occupational History  ? Not on file  ?Tobacco Use  ? Smoking status: Never  ?  Passive exposure: Never  ? Smokeless tobacco: Never  ?Substance and Sexual Activity  ? Alcohol use: Yes  ? Drug use: No  ? Sexual activity: Yes  ?  Birth control/protection: None  ?Other Topics Concern  ? Not on file  ?Social History Narrative  ? Married, no children.  ? Orig from [place], in [place] since about 2009.  ? Educ: masters deg  ? 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  ? No tob.  ? Alc a few days a week, 1-2 drinks  ? ?Social Determinants of Health  ? ?  Financial Resource Strain: Not on file  ?Food Insecurity: Not on file  ?Transportation Needs: Not on file  ?Physical Activity: Not on file  ?Stress: Not on file  ?Social Connections: Not on file  ?Intimate Partner Violence: Not on file  ? ? ?Outpatient Encounter Medications as of 05/23/2021  ?Medication Sig  ? Calcium 150 MG TABS Take 300 mg by mouth daily.  ? Cetirizine HCl (ZYRTEC ALLERGY) 10 MG CAPS Take 10 mg by mouth daily.  ? cholecalciferol (VITAMIN D3) 25 MCG (1000 UNIT) tablet Take 1,000 Units by mouth daily.  ? hydrOXYzine (VISTARIL) 25 MG capsule Take 25 mg by mouth as needed for sleep.  ? Magnesium 300 MG CAPS Take 300 mg by mouth daily.  ? Milk Thistle 150 MG CAPS Take 150 mg by mouth daily.  ? Multiple Vitamins-Minerals (CENTRUM ADULTS) TABS Take 1 tablet by  mouth daily.  ? Omega-3 Fatty Acids (OMEGA 3 PO) Take 690 mg by mouth daily.  ? saccharomyces boulardii (FLORASTOR) 250 MG capsule Take 250 mg by mouth daily as needed.  ? tranexamic acid (LYSTEDA) 650 MG TABS tablet Take 2 tablets by mouth as needed for bleeding.  ? Turmeric 500 MG CAPS Take 500 mg by mouth daily.  ? ?No facility-administered encounter medications on file as of 05/23/2021.  ? ? ?Allergies  ?Allergen Reactions  ? Codeine Nausea And Vomiting  ? Lorazepam Other (See Comments)  ?  agitation  ? Xyrem [Oxybate] Nausea And Vomiting  ? ? ?Review of Systems  ?Constitutional:  Negative for fatigue and fever.  ?HENT:  Negative for congestion and sore throat.   ?Eyes:  Negative for visual disturbance.  ?Respiratory:  Negative for cough.   ?Cardiovascular:  Negative for chest pain.  ?Gastrointestinal:  Negative for abdominal pain and nausea.  ?Genitourinary:  Negative for dysuria.  ?Musculoskeletal:  Negative for back pain and joint swelling.  ?Skin:  Negative for rash.  ?Neurological:  Negative for weakness and headaches.  ?Endo/Heme/Allergies:  Negative for adenopathy.  ?Review of Systems  ?Constitutional:  Negative for fatigue and fever.  ?HENT:  Negative for congestion and sore throat.   ?Eyes:  Negative for visual disturbance.  ?Respiratory:  Negative for cough.   ?Cardiovascular:  Negative for chest pain.  ?Gastrointestinal:  Negative for abdominal pain and nausea.  ?Genitourinary:  Negative for dysuria.  ?Musculoskeletal:  Negative for back pain and joint swelling.  ?Skin:  Negative for rash.  ?Neurological:  Negative for weakness and headaches.  ?Hematological:  Negative for adenopathy.  ? ?PE; ?Blood pressure 108/74, pulse 73, temperature 98.1 ?F (36.7 ?C), temperature source Oral, height 5' 5.75" (1.67 m), weight 134 lb 9.6 oz (61.1 kg), last menstrual period 05/21/2021, SpO2 100 %. ?Body mass index is 21.89 kg/m?. ? ?Physical Exam ? ?Gen: Alert, well appearing.  Patient is oriented to person, place,  time, and situation. ?AFFECT: pleasant, lucid thought and speech. ?TML:YYTK: no injection, icteris, swelling, or exudate.  EOMI, PERRLA. ?Mouth: lips without lesion/swelling.  Oral mucosa pink and moist. Oropharynx without erythema, exudate, or swelling.  ?CV: RRR, no m/r/g.   ?LUNGS: CTA bilat, nonlabored resps, good aeration in all lung fields. ?EXT: no clubbing or cyanosis.  no edema.  ? ?Pertinent labs:  ?Lab Results  ?Component Value Date  ? TSH 3.18 04/05/2021  ? ?Lab Results  ?Component Value Date  ? WBC 5.0 04/05/2021  ? HGB 14.6 04/05/2021  ? HCT 43 04/05/2021  ? MCV 93.9 01/31/2016  ? PLT 268 04/05/2021  ? ?  Lab Results  ?Component Value Date  ? CREATININE 0.6 04/05/2021  ? BUN 14 04/05/2021  ? NA 141 04/05/2021  ? K 3.9 04/05/2021  ? CL 102 04/05/2021  ? CO2 34 (A) 04/05/2021  ? ?Lab Results  ?Component Value Date  ? ALT 26 04/05/2021  ? AST 23 04/05/2021  ? ALKPHOS 62 04/05/2021  ? BILITOT 0.7 06/10/2008  ? ?Lab Results  ?Component Value Date  ? CHOL 180 04/05/2021  ? ?Lab Results  ?Component Value Date  ? HDL 88 (A) 04/05/2021  ? ?Lab Results  ?Component Value Date  ? [place] 85 04/05/2021  ? ?Lab Results  ?Component Value Date  ? TRIG 36 (A) 04/05/2021  ? ? ? ?ASSESSMENT AND PLAN:  ? ?New patient, establishing care. ? ?1) Disequilibrium syndrome: This seems to bother her minimally and has had extensive work-up in the past that did not reveal any specific cause. ? ?2) Menorrhagia.  Her GYN has her on light stated to use as needed. ? ?3) preventative health: ?Breast cancer screening, cervical cancer screening, and colon cancer screening all up-to-date.  Vaccines are all up-to-date. ? ?An After Visit Summary was printed and given to the patient. ? ?Return in 10 months (on 03/25/2022) for  annual CPE (fasting. ? ?Signed:  Crissie Sickles, MD           05/23/2021 ? ? ? ?

## 2021-06-15 ENCOUNTER — Encounter: Payer: Self-pay | Admitting: Family Medicine

## 2021-06-21 ENCOUNTER — Ambulatory Visit (INDEPENDENT_AMBULATORY_CARE_PROVIDER_SITE_OTHER): Payer: 59 | Admitting: Family Medicine

## 2021-06-21 VITALS — BP 115/75 | HR 70 | Temp 98.4°F | Ht 65.75 in | Wt 132.4 lb

## 2021-06-21 DIAGNOSIS — H6122 Impacted cerumen, left ear: Secondary | ICD-10-CM

## 2021-06-21 NOTE — Progress Notes (Signed)
OFFICE VISIT ? ?06/21/2021 ? ?CC:  ?Chief Complaint  ?Patient presents with  ? Ear Clogged  ?  Left ear; 1-2 weeks. Peroxide drops for maintenance, she used to use a bulb trying to suction things out but no longer does that. ENT; Dr.Moore  ? ? ?CC ? ?Patient is a 51 y.o. female who presents for left ear clogged. ? ?HPI: ?History of left ear cerumen impaction, has had to have it cleared out in the past.  Flushing the ear in the past did make her feel dizzy.  She has seen ENT once, has an appointment set for 07/10/2021 but wanted to come by here first to see if we could get it out. ?No recent URI symptoms. ?No ear pain. ?She does soft irrigation and suction at home periodically with water and peroxide mix. ? ? ?Past Medical History:  ?Diagnosis Date  ? BRCA gene positive   ? she gets breasts MRI as screening (per GYN)  ? Disequilibrium syndrome   ? many years, chronic lightheadedness with occ period of vertigo, extensive w/u in [place] and then in [place], many meds tried, no definite dx-->seemed to "even out" per pt  ? Dizziness 2011  ? chronic  ? History of adenomatous polyp of colon   ? recall 2025 Sadie Haber)  ? Menorrhagia   ? Lysteda as needed per GYN  ? Osteopenia   ? per GYN  ? ? ?Past Surgical History:  ?Procedure Laterality Date  ? COLONOSCOPY W/ POLYPECTOMY  03/02/2019  ? 2020 adenoma (Dr. Earlean Shawl) recall 2025  ? DILATATION & CURETTAGE/HYSTEROSCOPY WITH MYOSURE N/A 02/02/2016  ? Procedure: DILATATION & CURETTAGE/HYSTEROSCOPY WITH MYOSURE;  Surgeon: Alden Hipp, MD;  Location: [facility];  Service: Gynecology;  Laterality: N/A;  ? EYE SURGERY  2000  ? lasix  ? LASER ABLATION OF THE CERVIX  1987  ? MYRINGOTOMY Bilateral 1973  ? POLYPECTOMY N/A 02/02/2016  ? Procedure: POLYPECTOMY;  Surgeon: Alden Hipp, MD;  Location: [facility];  Service: Gynecology;  Laterality: N/A;  ? [place]  ? ? ?Outpatient Medications Prior to Visit  ?Medication Sig Dispense Refill  ? Calcium 150 MG TABS Take 300 mg by mouth  daily.    ? Cetirizine HCl (ZYRTEC ALLERGY) 10 MG CAPS Take 10 mg by mouth daily.    ? cholecalciferol (VITAMIN D3) 25 MCG (1000 UNIT) tablet Take 1,000 Units by mouth daily.    ? hydrOXYzine (VISTARIL) 25 MG capsule Take 25 mg by mouth as needed for sleep.    ? Magnesium 300 MG CAPS Take 300 mg by mouth daily.    ? Milk Thistle 150 MG CAPS Take 150 mg by mouth daily.    ? Multiple Vitamins-Minerals (CENTRUM ADULTS) TABS Take 1 tablet by mouth daily.    ? Omega-3 Fatty Acids (OMEGA 3 PO) Take 690 mg by mouth daily.    ? saccharomyces boulardii (FLORASTOR) 250 MG capsule Take 250 mg by mouth daily as needed.    ? tranexamic acid (LYSTEDA) 650 MG TABS tablet Take 2 tablets by mouth as needed for bleeding.    ? Turmeric 500 MG CAPS Take 500 mg by mouth daily.    ? ?No facility-administered medications prior to visit.  ? ? ?Allergies  ?Allergen Reactions  ? Codeine Nausea And Vomiting  ? Lorazepam Other (See Comments)  ?  agitation  ? Xyrem [Oxybate] Nausea And Vomiting  ? ? ?ROS ?As per HPI ? ?PE: ? ?  06/21/2021  ?  2:57 PM  05/23/2021  ?  1:12 PM 08/10/2020  ? 10:12 AM  ?Vitals with BMI  ?Height 5' 5.75" 5' 5.75" 5' 6"  ?Weight 132 lbs 6 oz 134 lbs 10 oz 131 lbs  ?BMI 21.53 21.89 21.15  ?Systolic 335 456 256  ?Diastolic 75 74 79  ?Pulse 70 73   ? ? ? ?Physical Exam ? ?Gen: Alert, well appearing.  Patient is oriented to person, place, time, and situation. ?AFFECT: pleasant, lucid thought and speech. ?L EAC minimal cerumen, normal TM. ?R EAC with moderate cerumen, mostly mid canal but reachable with curette. ?Used illuminated ear curette today to extract a moderate amount of soft yellow cerumen. ?Pt had mild discomfort with this but no complications. ? ? ?LABS:  ?none ? ?IMPRESSION AND PLAN: ? ?Left ear cerumen impaction. ?Consent obtained. ?Used illuminated ear curette today to extract a moderate amount of soft yellow cerumen. ?Pt had mild discomfort with this but no complications. ?She felt mild improvement in left ear  symptoms after this.  The ear canal was at least 75% clear after the cerumen extraction.  TM appeared normal.  No canal swelling, erythema, or abrasion/ulceration. ?Instructions for home care to prevent wax buildup are given. ? ?An After Visit Summary was printed and given to the patient. ? ?FOLLOW UP: No follow-ups on file. ? ?Signed:  Crissie Sickles, MD           06/21/2021 ? ?

## 2021-09-26 ENCOUNTER — Other Ambulatory Visit: Payer: Self-pay | Admitting: Obstetrics and Gynecology

## 2021-09-26 DIAGNOSIS — Z803 Family history of malignant neoplasm of breast: Secondary | ICD-10-CM

## 2021-10-02 ENCOUNTER — Ambulatory Visit
Admission: RE | Admit: 2021-10-02 | Discharge: 2021-10-02 | Disposition: A | Discharge status: Home / Self Care | Payer: 59 | Source: Ambulatory Visit | Attending: Obstetrics and Gynecology | Admitting: Obstetrics and Gynecology

## 2021-10-02 DIAGNOSIS — Z803 Family history of malignant neoplasm of breast: Secondary | ICD-10-CM

## 2021-10-02 MED ORDER — GADOBUTROL 1 MMOL/ML IV SOLN
6.0000 mL | Freq: Once | INTRAVENOUS | Status: AC | PRN
  Administered 2021-10-02: 6 mL via INTRAVENOUS

## 2021-11-08 ENCOUNTER — Other Ambulatory Visit: Payer: Self-pay | Admitting: Obstetrics and Gynecology

## 2021-11-08 DIAGNOSIS — Z1231 Encounter for screening mammogram for malignant neoplasm of breast: Secondary | ICD-10-CM

## 2021-12-26 ENCOUNTER — Ambulatory Visit
Admission: RE | Admit: 2021-12-26 | Discharge: 2021-12-26 | Disposition: A | Discharge status: Home / Self Care | Payer: 59 | Source: Ambulatory Visit | Attending: Obstetrics and Gynecology | Admitting: Obstetrics and Gynecology

## 2021-12-26 DIAGNOSIS — Z1231 Encounter for screening mammogram for malignant neoplasm of breast: Secondary | ICD-10-CM

## 2022-04-10 ENCOUNTER — Encounter: Payer: 59 | Admitting: Family Medicine

## 2022-04-12 NOTE — Patient Instructions (Signed)

## 2022-04-16 ENCOUNTER — Ambulatory Visit (INDEPENDENT_AMBULATORY_CARE_PROVIDER_SITE_OTHER): Payer: 59 | Admitting: Family Medicine

## 2022-04-16 ENCOUNTER — Encounter: Payer: Self-pay | Admitting: Family Medicine

## 2022-04-16 VITALS — BP 130/74 | HR 78 | Temp 98.0°F | Ht 66.0 in | Wt 133.0 lb

## 2022-04-16 DIAGNOSIS — Z0000 Encounter for general adult medical examination without abnormal findings: Secondary | ICD-10-CM

## 2022-04-16 DIAGNOSIS — E559 Vitamin D deficiency, unspecified: Secondary | ICD-10-CM

## 2022-04-16 LAB — LIPID PANEL
Cholesterol: 172 mg/dL (ref 0–200)
HDL: 87.1 mg/dL (ref >39.00)
LDL Cholesterol: 78 mg/dL (ref 0–99)
NonHDL: 85.13
Total CHOL/HDL Ratio: 2
Triglycerides: 36 mg/dL (ref 0.0–149.0)
VLDL: 7.2 mg/dL (ref 0.0–40.0)

## 2022-04-16 LAB — COMPREHENSIVE METABOLIC PANEL
ALT: 35 U/L (ref 0–35)
AST: 30 U/L (ref 0–37)
Albumin: 4.4 g/dL (ref 3.5–5.2)
Alkaline Phosphatase: 64 U/L (ref 39–117)
BUN: 16 mg/dL (ref 6–23)
CO2: 32 mEq/L (ref 19–32)
Calcium: 9.4 mg/dL (ref 8.4–10.5)
Chloride: 99 mEq/L (ref 96–112)
Creatinine, Ser: 0.58 mg/dL (ref 0.40–1.20)
GFR: 103.41 mL/min (ref >60.00)
Glucose, Bld: 90 mg/dL (ref 70–99)
Potassium: 3.8 mEq/L (ref 3.5–5.1)
Sodium: 140 mEq/L (ref 135–145)
Total Bilirubin: 0.5 mg/dL (ref 0.2–1.2)
Total Protein: 6.9 g/dL (ref 6.0–8.3)

## 2022-04-16 LAB — CBC
HCT: 42.1 % (ref 36.0–46.0)
Hemoglobin: 14.5 g/dL (ref 12.0–15.0)
MCHC: 34.5 g/dL (ref 30.0–36.0)
MCV: 94.1 fl (ref 78.0–100.0)
Platelets: 244 10*3/uL (ref 150.0–400.0)
RBC: 4.47 Mil/uL (ref 3.87–5.11)
RDW: 12.4 % (ref 11.5–15.5)
WBC: 5.2 10*3/uL (ref 4.0–10.5)

## 2022-04-16 LAB — HEMOGLOBIN A1C: Hgb A1c MFr Bld: 5.6 % (ref 4.6–6.5)

## 2022-04-16 LAB — VITAMIN D 25 HYDROXY (VIT D DEFICIENCY, FRACTURES): VITD: 36.35 ng/mL (ref 30.00–100.00)

## 2022-04-16 LAB — TSH: TSH: 3.14 u[IU]/mL (ref 0.35–5.50)

## 2022-04-16 NOTE — Progress Notes (Signed)
Office Note 04/16/2022  CC:  Chief Complaint  Patient presents with   Annual Exam    Pt is fasting    HPI:  Patient is a 52 y.o. female who is here for annual health maintenance exam. Peggy Obrien feels well. She had a respiratory illness over the holiday and this completely resolved as of about 2 weeks ago.  Past Medical History:  Diagnosis Date   BRCA gene positive    she gets breasts MRI as screening (per GYN)   Disequilibrium syndrome    many years, chronic lightheadedness with occ period of vertigo, extensive w/u in [place] and then in [place], many meds tried, no definite dx-->seemed to "even out" per pt   Dizziness 2011   chronic   History of adenomatous polyp of colon    recall 2025 [facility])   Menorrhagia    Lysteda as needed per GYN   Osteopenia    per GYN    Past Surgical History:  Procedure Laterality Date   COLONOSCOPY W/ POLYPECTOMY  03/02/2019   2020 adenoma (Dr. Earlean Shawl) recall [place] N/A 02/02/2016   Procedure: [place];  Surgeon: Alden Hipp, MD;  Location: [facility];  Service: Gynecology;  Laterality: N/A;   EYE SURGERY  2000   lasix   LASER ABLATION OF THE CERVIX  1987   MYRINGOTOMY Bilateral 1973   POLYPECTOMY N/A 02/02/2016   Procedure: POLYPECTOMY;  Surgeon: Alden Hipp, MD;  Location: [facility];  Service: Gynecology;  Laterality: N/A;   WISDOM TOOTH EXTRACTION  1989    Family History  Problem Relation Age of Onset   High Cholesterol Mother    Multiple myeloma Father    Heart disease Father    High blood pressure Father    Early death Brother    High blood pressure Brother    Breast cancer Maternal Grandmother    Early death Maternal Grandfather    Heart attack Maternal Grandfather    Diabetes Paternal Grandfather     Social History   Socioeconomic History   Marital status: Married    Spouse name: Not on file   Number of children: Not on file   Years  of education: Not on file   Highest education level: Master's degree (e.g., MA, MS, MEng, MEd, MSW, MBA)  Occupational History   Not on file  Tobacco Use   Smoking status: Never    Passive exposure: Never   Smokeless tobacco: Never  Substance and Sexual Activity   Alcohol use: Yes   Drug use: No   Sexual activity: Yes    Birth control/protection: None  Other Topics Concern   Not on file  Social History Narrative   Married, no children.   Orig from [employer], in [place] since about 2009.   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   No tob.   Alc a few days a week, 1-2 drinks   Social Determinants of Health   Financial Resource Strain: Low Risk  (06/19/2021)   Overall Financial Resource Strain (CARDIA)    Difficulty of Paying Living Expenses: Not hard at all  Food Insecurity: No Food Insecurity (06/19/2021)   Hunger Vital Sign    Worried About Running Out of Food in the Last Year: Never true    Ran Out of Food in the Last Year: Never true  Transportation Needs: No Transportation Needs (06/19/2021)   PRAPARE - Transportation  Lack of Transportation (Medical): No    Lack of Transportation (Non-Medical): No  Physical Activity: Sufficiently Active (06/19/2021)   Exercise Vital Sign    Days of Exercise per Week: 6 days    Minutes of Exercise per Session: 60 min  Stress: No Stress Concern Present (06/19/2021)   [place]    Feeling of Stress : Not at all  Social Connections: [place] (06/19/2021)   Social Connection and Isolation Panel [NHANES]    Frequency of Communication with Friends and Family: Twice a week    Frequency of Social Gatherings with Friends and Family: Twice a week    Attends Religious Services: 1 to 4 times per year    Active Member of [employer] or Organizations: Yes    Attends Archivist Meetings: Never    Marital Status: Married   Human resources officer Violence: Not on file    Outpatient Medications Prior to Visit  Medication Sig Dispense Refill   Calcium 150 MG TABS Take 300 mg by mouth daily.     Cetirizine HCl (ZYRTEC ALLERGY) 10 MG CAPS Take 10 mg by mouth daily.     cholecalciferol (VITAMIN D3) 25 MCG (1000 UNIT) tablet Take 1,000 Units by mouth daily.     hydrOXYzine (VISTARIL) 25 MG capsule Take 25 mg by mouth as needed for sleep.     Magnesium 300 MG CAPS Take 300 mg by mouth daily.     Milk Thistle 150 MG CAPS Take 150 mg by mouth daily.     Multiple Vitamins-Minerals (CENTRUM ADULTS) TABS Take 1 tablet by mouth daily.     Omega-3 Fatty Acids (OMEGA 3 PO) Take 690 mg by mouth daily.     saccharomyces boulardii (FLORASTOR) 250 MG capsule Take 250 mg by mouth daily as needed.     tranexamic acid (LYSTEDA) 650 MG TABS tablet Take 2 tablets by mouth as needed for bleeding.     Turmeric 500 MG CAPS Take 500 mg by mouth daily.     No facility-administered medications prior to visit.    Allergies  Allergen Reactions   Codeine Nausea And Vomiting   Lorazepam Other (See Comments)    agitation   Xyrem [Oxybate] Nausea And Vomiting    Review of Systems  Constitutional:  Negative for appetite change, chills, fatigue and fever.  HENT:  Negative for congestion, dental problem, ear pain and sore throat.   Eyes:  Negative for discharge, redness and visual disturbance.  Respiratory:  Negative for cough, chest tightness, shortness of breath and wheezing.   Cardiovascular:  Negative for chest pain, palpitations and leg swelling.  Gastrointestinal:  Negative for abdominal pain, blood in stool, diarrhea, nausea and vomiting.  Genitourinary:  Negative for difficulty urinating, dysuria, flank pain, frequency, hematuria and urgency.  Musculoskeletal:  Negative for arthralgias, back pain, joint swelling, myalgias and neck stiffness.  Skin:  Negative for pallor and rash.  Neurological:  Negative for dizziness, speech  difficulty, weakness and headaches.  Hematological:  Negative for adenopathy. Does not bruise/bleed easily.  Psychiatric/Behavioral:  Negative for confusion and sleep disturbance. The patient is not nervous/anxious.     PE;    04/16/2022    8:18 AM 06/21/2021    2:57 PM 05/23/2021    1:12 PM  Vitals with BMI  Height 5\' 6"  5' 5.75" 5' 5.75"  Weight 133 lbs 132 lbs 6 oz 134 lbs 10 oz  BMI 21.48 85.27 78.24  Systolic 235 361 443  Diastolic 94 75 74  Pulse 78 70 73  Rpt bp today 130/74  Exam chaperoned by Harlene Salts, CMA  Gen: Alert, well appearing.  Patient is oriented to person, place, time, and situation. AFFECT: pleasant, lucid thought and speech. ENT: Ears: EACs clear, normal epithelium.  TMs with good light reflex and landmarks bilaterally.  Eyes: no injection, icteris, swelling, or exudate.  EOMI, PERRLA. Nose: no drainage or turbinate edema/swelling.  No injection or focal lesion.  Mouth: lips without lesion/swelling.  Oral mucosa pink and moist.  Dentition intact and without obvious caries or gingival swelling.  Oropharynx without erythema, exudate, or swelling.  Neck: supple/nontender.  No LAD, mass, or TM.  Carotid pulses 2+ bilaterally, without bruits. CV: RRR, no m/r/g.   LUNGS: CTA bilat, nonlabored resps, good aeration in all lung fields. Chest wall: Mild asymmetry of chest wall--left lower anterior rib region with mild protrusion.  Nontender.  No crepitus or hypermobility. ABD: soft, NT, ND, BS normal.  No hepatospenomegaly or mass.  No bruits. EXT: no clubbing, cyanosis, or edema.  Musculoskeletal: no joint swelling, erythema, warmth, or tenderness.  ROM of all joints intact. Skin - no sores or suspicious lesions or rashes or color changes   Pertinent labs:  Lab Results  Component Value Date   TSH 3.18 04/05/2021   Lab Results  Component Value Date   WBC 5.0 04/05/2021   HGB 14.6 04/05/2021   HCT 43 04/05/2021   MCV 93.9 01/31/2016   PLT 268 04/05/2021    Lab Results  Component Value Date   CREATININE 0.6 04/05/2021   BUN 14 04/05/2021   NA 141 04/05/2021   K 3.9 04/05/2021   CL 102 04/05/2021   CO2 34 (A) 04/05/2021   Lab Results  Component Value Date   ALT 26 04/05/2021   AST 23 04/05/2021   ALKPHOS 62 04/05/2021   BILITOT 0.7 06/10/2008   Lab Results  Component Value Date   CHOL 180 04/05/2021   Lab Results  Component Value Date   HDL 88 (A) 04/05/2021   Lab Results  Component Value Date   LDLCALC 85 04/05/2021   Lab Results  Component Value Date   TRIG 36 (A) 04/05/2021   Lab Results  Component Value Date   HGBA1C 5.4 04/05/2021   ASSESSMENT AND PLAN:   Health maintenance exam: Reviewed age and gender appropriate health maintenance issues (prudent diet, regular exercise, health risks of tobacco and excessive alcohol, use of seatbelts, fire alarms in home, use of sunscreen).  Also reviewed age and gender appropriate health screening as well as vaccine recommendations. Vaccines: All up-to-date Labs: Fasting health panel today Cervical ca screening: UTD with GYN MD. Breast ca screening: Mammogram up-to-date 12/26/2021. Colon ca screening: recall 2025  An After Visit Summary was printed and given to the patient.  FOLLOW UP:  Return in about 1 year (around 04/17/2023) for annual CPE (fasting).  Signed:  Santiago Bumpers, MD           04/16/2022

## 2022-05-27 ENCOUNTER — Encounter: Payer: Self-pay | Admitting: Family Medicine

## 2022-09-03 ENCOUNTER — Ambulatory Visit (INDEPENDENT_AMBULATORY_CARE_PROVIDER_SITE_OTHER): Payer: 59 | Admitting: Sports Medicine

## 2022-09-03 VITALS — BP 130/82 | Ht 66.0 in | Wt 132.0 lb

## 2022-09-03 DIAGNOSIS — M79671 Pain in right foot: Secondary | ICD-10-CM | POA: Diagnosis not present

## 2022-09-03 DIAGNOSIS — M79672 Pain in left foot: Secondary | ICD-10-CM

## 2022-09-03 NOTE — Progress Notes (Signed)
   Subjective:    Patient ID: Peggy Obrien, female    DOB: Dec 01, 1970, 52 y.o.   MRN: [id number]  HPI chief complaint: Bilateral heel pain  Patient presents today with concerns about bilateral heel pain, left greater than right.  She has had chronic foot pain for many years.  A couple of weeks ago her pain worsened.  It seemed to be improving up until this past Sunday when she felt a zinging type of pain in her left heel.  She localizes her pain to the plantar aspect of both heels.  She is concerned about Achilles tendon rupture with playing tennis but currently denies any pain at the Achilles tendon.  She wears body helix compression sleeves for her ankles which she finds beneficial.  She also takes over-the-counter anti-inflammatories as needed.    Review of Systems As above    Objective:   Physical Exam  Well-developed, well-nourished.  Examination of both ankles shows no tenderness to palpation along the Achilles tendon.  No soft tissue swelling.  Minimal tenderness to palpation over the left plantar fascia.  Good pulses.  Limited MSK ultrasound of the left Achilles tendon and plantar fascia show no obvious abnormalities.  Both structures demonstrate normal thickness without hypoechoic changes.      Assessment & Plan:   Bilateral heel pain likely secondary to mild plantar fascial pain  Reassurance regarding her Achilles tendon.  Ultrasound shows no evidence of tendon disruption or tendinopathy.  I do think she may benefit from some gel cups in her tennis shoes.  I would also recommend that she continue with her body helix ankle compression sleeves when working out.  She may continue with all activities as tolerated without restriction and will follow-up with me as needed.  This note was dictated using Dragon naturally speaking software and may contain errors in syntax, spelling, or content which have not been identified prior to signing this note.

## 2022-11-29 ENCOUNTER — Other Ambulatory Visit: Payer: Self-pay | Admitting: Obstetrics and Gynecology

## 2022-11-29 DIAGNOSIS — Z1231 Encounter for screening mammogram for malignant neoplasm of breast: Secondary | ICD-10-CM

## 2023-01-01 ENCOUNTER — Ambulatory Visit: Payer: 59

## 2023-01-29 ENCOUNTER — Ambulatory Visit: Payer: 59

## 2023-02-13 ENCOUNTER — Other Ambulatory Visit: Payer: Self-pay | Admitting: Obstetrics and Gynecology

## 2023-02-13 ENCOUNTER — Other Ambulatory Visit: Payer: Self-pay | Admitting: Neurosurgery

## 2023-02-13 DIAGNOSIS — Z803 Family history of malignant neoplasm of breast: Secondary | ICD-10-CM

## 2023-02-17 ENCOUNTER — Ambulatory Visit: Payer: 59

## 2023-02-19 ENCOUNTER — Ambulatory Visit
Admission: RE | Admit: 2023-02-19 | Discharge: 2023-02-19 | Disposition: A | Discharge status: Home / Self Care | Payer: 59 | Source: Ambulatory Visit | Attending: Obstetrics and Gynecology | Admitting: Obstetrics and Gynecology

## 2023-02-19 DIAGNOSIS — Z1231 Encounter for screening mammogram for malignant neoplasm of breast: Secondary | ICD-10-CM

## 2023-03-04 ENCOUNTER — Telehealth: Payer: Self-pay | Admitting: *Deleted

## 2023-03-04 NOTE — Telephone Encounter (Signed)
Attempted to reach Ms. Peggy Obrien for new patient appointment. Left voicemail requesting call back to [phone].

## 2023-03-05 NOTE — Telephone Encounter (Signed)
Spoke with the patient regarding the referral to GYN oncology. Patient scheduled as new patient with *Dr Alvester Morin on 1/13 at 11:15 am. Patient given an arrival time of *10:45 am.  Explained to the patient the the doctor will perform a pelvic exam at this visit. Patient given the policy that only one visitor allowed and that visitor must be over 16 yrs are allowed in the Cancer Center. Patient given the address/phone number for the clinic and that the center offers free valet service. Patient aware that masks are option.

## 2023-03-26 HISTORY — PX: MOHS SURGERY: SUR867

## 2023-04-04 ENCOUNTER — Encounter: Payer: Self-pay | Admitting: Psychiatry

## 2023-04-07 ENCOUNTER — Encounter: Payer: Self-pay | Admitting: Psychiatry

## 2023-04-07 ENCOUNTER — Inpatient Hospital Stay: Payer: 59 | Attending: Psychiatry | Admitting: Psychiatry

## 2023-04-07 ENCOUNTER — Inpatient Hospital Stay (HOSPITAL_BASED_OUTPATIENT_CLINIC_OR_DEPARTMENT_OTHER): Payer: 59 | Admitting: Gynecologic Oncology

## 2023-04-07 ENCOUNTER — Other Ambulatory Visit: Payer: Self-pay | Admitting: Gynecologic Oncology

## 2023-04-07 VITALS — BP 131/85 | HR 92 | Temp 98.1°F | Resp 20 | Ht 66.0 in | Wt 131.2 lb

## 2023-04-07 DIAGNOSIS — D071 Carcinoma in situ of vulva: Secondary | ICD-10-CM | POA: Insufficient documentation

## 2023-04-07 DIAGNOSIS — Z807 Family history of other malignant neoplasms of lymphoid, hematopoietic and related tissues: Secondary | ICD-10-CM | POA: Insufficient documentation

## 2023-04-07 DIAGNOSIS — Z85828 Personal history of other malignant neoplasm of skin: Secondary | ICD-10-CM | POA: Diagnosis not present

## 2023-04-07 DIAGNOSIS — M8580 Other specified disorders of bone density and structure, unspecified site: Secondary | ICD-10-CM | POA: Insufficient documentation

## 2023-04-07 DIAGNOSIS — R42 Dizziness and giddiness: Secondary | ICD-10-CM | POA: Insufficient documentation

## 2023-04-07 DIAGNOSIS — N939 Abnormal uterine and vaginal bleeding, unspecified: Secondary | ICD-10-CM | POA: Diagnosis not present

## 2023-04-07 DIAGNOSIS — Z803 Family history of malignant neoplasm of breast: Secondary | ICD-10-CM | POA: Insufficient documentation

## 2023-04-07 DIAGNOSIS — Z860101 Personal history of adenomatous and serrated colon polyps: Secondary | ICD-10-CM | POA: Diagnosis not present

## 2023-04-07 DIAGNOSIS — Z79899 Other long term (current) drug therapy: Secondary | ICD-10-CM | POA: Diagnosis not present

## 2023-04-07 MED ORDER — SENEXON-S 8.6-50 MG PO TABS: 2.0000 | ORAL_TABLET | Freq: Every day | ORAL | 0 refills | Status: DC

## 2023-04-07 MED ORDER — IBUPROFEN 800 MG PO TABS: 800.0000 mg | ORAL_TABLET | Freq: Three times a day (TID) | ORAL | 0 refills | Status: DC | PRN

## 2023-04-07 MED ORDER — TRAMADOL HCL 50 MG PO TABS: 50.0000 mg | ORAL_TABLET | Freq: Four times a day (QID) | ORAL | 0 refills | Status: DC | PRN

## 2023-04-07 NOTE — H&P (View-Only) (Signed)
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  We reviewed the nature of vulvar dysplasia. Surgical excision is the mainstay of treatment, but ablative therapy or pharmacologic treatment is an option for some patients in certain clinical scenarios. The goals of treatment of vulvar dysplasia are to prevent development of vulvar squamous carcinoma and relieve any associated symptoms, such as pain or itching. The goal is also preserve vulvar anatomy as best as possible.  Excision provides both treatment and a diagnostic specimen for those women with high-grade dysplasia. Invasive squamous cell carcinoma is present at the time of excision in 10-22% of women with VIN on initial biopsy.    She has elected to proceed with surgical excision.  We also reviewed her abnormal bleeding with recently obtain [facility] that is elevated into the postmenopausal range.  Given this, her bleeding might be categorized as postmenopausal bleeding, however, she has not gone 12 months without a menstrual cycle.  In the setting, we discussed consideration of a D&C at time of her vulvectomy procedure for pathologic evaluation of her endometrial lining.  She is in agreement with this.  Patient was consented for: Simple partial vulvectomy, dilation and curettage on 04/15/23.  The risks of surgery were discussed in detail and she understands these to including but not limited to bleeding requiring a blood transfusion, infection, injury to adjacent organs (including but not limited to the rectum, urethra, clitoris, nearby vessels and nerves), wound separation, unforseen complication, and possible need for re-exploration.  If the patient experiences any of these events, she understands that her hospitalization or recovery may be prolonged and that she may need to take  additional medications for a prolonged period. The patient will receive DVT and antibiotic prophylaxis as indicated. She voiced a clear understanding. She had the opportunity to ask questions and informed consent was obtained today. She wishes to proceed.  She does not require preoperative clearance. Her METs are >4.  All preoperative instructions were reviewed. Postoperative expectations were also reviewed. Written handouts were provided to the patient.   A copy of this note was sent to the patient's referring provider.  Clide Cliff, MD Gynecologic Oncology   Medical Decision Making I personally spent  TOTAL 50 minutes face-to-face and non-face-to-face in the care of this patient, which includes all pre, intra, and post visit time on the date of service.   ------------  CC: vulvar dysplasia  HISTORY OF PRESENT ILLNESS:  Peggy Obrien is a 53 y.o. woman who is seen in consultation at the request of Richardean Chimera, MD for evaluation of vulvar dysplasia.  Patient was seen 02/05/2023 for an annual exam at which time she was noted on exam to have 2 whitish areas on the right side of her perineal body, slightly thickened.  She returned on 02/12/2023 for biopsy.  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  Today patient presents with her husband.  Patient reports that she had not noticed any lesions prior to her biopsy.  Denies vulvar itching prior to her biopsies.  Otherwise pt notes ongoing menstrual cycles. She reports having an FSH drawn at the time of her annual exam that came back elevated at 58.6. She subsequently had a bleed in December.    PAST MEDICAL HISTORY: Past Medical History:  Diagnosis Date   Basal cell carcinoma    nose. inder left nostril  Disequilibrium syndrome    many years, chronic lightheadedness with occ period of vertigo, extensive w/u in [place] and then in [place], many meds tried, no definite dx-->seemed to "even out" per pt   Dizziness 2011   chronic   History of  adenomatous polyp of colon    recall 2025 Deboraha Sprang)   Menorrhagia    Lysteda as needed per GYN   Osteopenia    per GYN    PAST SURGICAL HISTORY: Past Surgical History:  Procedure Laterality Date   COLONOSCOPY W/ POLYPECTOMY  03/02/2019   2020 adenoma (Dr. Kinnie Scales) recall 2025   DEXA     01/2021 at GYN, T score -1.7   DILATATION & CURETTAGE/HYSTEROSCOPY WITH MYOSURE N/A 02/02/2016   Procedure: DILATATION & CURETTAGE/HYSTEROSCOPY WITH MYOSURE;  Surgeon: Ilda Mori, MD;  Location: [facility];  Service: Gynecology;  Laterality: N/A;   EYE SURGERY  2000   lasix   HYSTEROSCOPY WITH D & C  2020   LASER ABLATION OF THE CERVIX  1987   MOHS SURGERY  2025   MYRINGOTOMY Bilateral 1973   POLYPECTOMY N/A 02/02/2016   Procedure: POLYPECTOMY;  Surgeon: Ilda Mori, MD;  Location: [facility];  Service: Gynecology;  Laterality: N/A;   WISDOM TOOTH EXTRACTION  1989    OB/GYN HISTORY: OB History  Gravida Para Term Preterm AB Living  0 0 0 0 0 0  SAB IAB Ectopic Multiple Live Births  0 0 0 0 0      Age at menarche: 11 Age at menopause: still having menses Hx of HRT: OCPs in past Hx of STI: no Last pap: 2024 nml History of abnormal pap smears: 1987 treated with laser, normal since that time  SCREENING STUDIES:  Last mammogram: 01/2023 Last colonoscopy: 2020 (5 year follow-up)  MEDICATIONS:  Current Outpatient Medications:    Calcium 150 MG TABS, Take 300 mg by mouth daily., Disp: , Rfl:    Carboxymethylcellulose Sod PF (REFRESH PLUS) 0.5 % SOLN, Apply to eye., Disp: , Rfl:    cholecalciferol (VITAMIN D3) 25 MCG (1000 UNIT) tablet, Take 1,000 Units by mouth daily., Disp: , Rfl:    hydrOXYzine (VISTARIL) 25 MG capsule, Take 25 mg by mouth as needed for sleep., Disp: , Rfl:    loratadine (CLARITIN) 10 MG tablet, Take 10 mg by mouth daily., Disp: , Rfl:    Magnesium 300 MG CAPS, Take 300 mg by mouth daily., Disp: , Rfl:    Milk Thistle 150 MG CAPS, Take 150 mg by mouth daily., Disp: , Rfl:     Multiple Vitamins-Minerals (CENTRUM ADULTS) TABS, Take 1 tablet by mouth daily., Disp: , Rfl:    Omega-3 Fatty Acids (OMEGA 3 PO), Take 690 mg by mouth daily., Disp: , Rfl:    saccharomyces boulardii (FLORASTOR) 250 MG capsule, Take 250 mg by mouth daily as needed., Disp: , Rfl:    Turmeric 500 MG CAPS, Take 500 mg by mouth daily., Disp: , Rfl:    tranexamic acid (LYSTEDA) 650 MG TABS tablet, Take 2 tablets by mouth as needed for bleeding. (Patient not taking: Reported on 04/04/2023), Disp: , Rfl:   ALLERGIES: Allergies  Allergen Reactions   Codeine Nausea And Vomiting   Lorazepam Other (See Comments)    agitation   Xyrem [Oxybate] Nausea And Vomiting    FAMILY HISTORY: Family History  Problem Relation Age of Onset   High Cholesterol Mother    Multiple myeloma Father    Heart disease Father    High blood pressure  Father    Early death Brother    High blood pressure Brother    Breast cancer Maternal Grandmother    Early death Maternal Grandfather    Heart attack Maternal Grandfather    Diabetes Paternal Grandfather    Prostate cancer Neg Hx    Endometrial cancer Neg Hx    Ovarian cancer Neg Hx    Colon cancer Neg Hx    Pancreatic cancer Neg Hx     SOCIAL HISTORY: Social History   Socioeconomic History   Marital status: Married    Spouse name: Not on file   Number of children: Not on file   Years of education: Not on file   Highest education level: Master's degree (e.g., MA, MS, MEng, MEd, MSW, MBA)  Occupational History   Not on file  Tobacco Use   Smoking status: Never    Passive exposure: Never   Smokeless tobacco: Never  Vaping Use   Vaping status: Never Used  Substance and Sexual Activity   Alcohol use: Yes    Alcohol/week: 3.0 standard drinks of alcohol    Types: 3 Glasses of wine per week   Drug use: No   Sexual activity: Yes    Birth control/protection: None  Other Topics Concern   Not on file  Social History Narrative   Married, no children.    Orig from [place], in [place] since about 2009.   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   No tob.   Alc a few days a week, 1-2 drinks   Social Drivers of Corporate investment banker Strain: Low Risk  (06/19/2021)   Overall Financial Resource Strain (CARDIA)    Difficulty of Paying Living Expenses: Not hard at all  Food Insecurity: No Food Insecurity (06/19/2021)   Hunger Vital Sign    Worried About Running Out of Food in the Last Year: Never true    Ran Out of Food in the Last Year: Never true  Transportation Needs: No Transportation Needs (06/19/2021)   PRAPARE - Administrator, Civil Service (Medical): No    Lack of Transportation (Non-Medical): No  Physical Activity: Sufficiently Active (06/19/2021)   Exercise Vital Sign    Days of Exercise per Week: 6 days    Minutes of Exercise per Session: 60 min  Stress: No Stress Concern Present (06/19/2021)   [employer] of Occupational Health - Occupational Stress Questionnaire    Feeling of Stress : Not at all  Social Connections: Unknown (01/03/2023)   [facility]   Social Network    Social Network: Not on file  Intimate Partner Violence: Unknown (01/03/2023)   Received from [facility]   HITS    Physically Hurt: Not on file    Insult or Talk Down To: Not on file    Threaten Physical Harm: Not on file    Scream or Curse: Not on file    REVIEW OF SYSTEMS: New patient intake form was reviewed.  Complete 10-system review is negative except for the following: None  PHYSICAL EXAM: BP 131/85 (BP Location: Left Arm, Patient Position: Sitting)   Pulse 92   Temp 98.1 F (36.7 C) (Oral)   Resp 20   Ht 5\' 6"  (1.676 m)   Wt 131 lb 3.2 oz (59.5 kg)   SpO2 100%   BMI 21.18 kg/m  Constitutional: No acute distress. Neuro/Psych: Alert, oriented.  Head and Neck: Normocephalic, atraumatic. Neck symmetric  without masses. Sclera anicteric.  Respiratory: Normal  work of breathing. Clear to auscultation bilaterally. Cardiovascular: Regular rate and rhythm, no murmurs, rubs, or gallops. Abdomen: Normoactive bowel sounds. Soft, non-distended, non-tender to palpation. No masses appreciated.  Extremities: Grossly normal range of motion. Warm, well perfused. No edema bilaterally. Skin: No rashes or lesions. Lymphatic: No cervical, supraclavicular, or inguinal adenopathy. Genitourinary: External genitalia with slightly raised white lesions on the right posterior fourchette of the vulva.  Urethral meatus without lesions or prolapse. On speculum exam, vagina and cervix without lesions. Bimanual exam reveals normal cervix and uterus, mobile.  No adnexal masses. Exam chaperoned by Warner Mccreedy, NP  VULVAR COLPOSCOPY PROCEDURE NOTE  Procedure Details: After appropriate verbal informed consent was obtained, a timeout was performed. Acetic acid was applied to the vulva and the vulva was inspected with the colposcope with the findings as noted below. The vulva was then cleansed of the acetic acid with water. The patient tolerated the procedure well.   Adequate Exam: yes  Biopsy Specimen: none  Condition: Stable. Patient tolerated procedure well.  Complications: None  Findings: aceotwhite changes of the right posterior fourchette and extending onto perineal body. 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  Outside laboratory, pathology were reviewed, with pertinent results below.    WBC  Date Value Ref Range Status  04/16/2022 5.2 4.0 - 10.5 K/uL Final   Hemoglobin  Date Value Ref Range Status  04/16/2022 14.5 12.0 - 15.0 g/dL Final   HCT  Date Value Ref Range Status  04/16/2022 42.1 36.0 - 46.0 % Final   Platelets  Date Value Ref Range Status  04/16/2022 244.0  150.0 - 400.0 K/uL Final   Creatinine, Ser  Date Value Ref Range Status  04/16/2022 0.58 0.40 - 1.20 mg/dL Final   AST  Date Value Ref Range Status  04/16/2022 30 0 - 37 U/L Final   ALT  Date Value Ref Range Status  04/16/2022 35 0 - 35 U/L Final   CHL HPV  Date Value Ref Range Status  01/29/2021 Negative  Final    Surgical pathology (02/12/2023): Vulva, biopsy, lesion: High-grade squamous intraepithelial lesion (VIN 2-3, high-grade dysplasia)

## 2023-04-07 NOTE — Patient Instructions (Addendum)
 Preparing for your Surgery  Plan for surgery on April 15, 2023 with Dr. [name] at [facility]. You will be scheduled for pelvic examination under anesthesia, simple partial vulvectomy, dilation and curettage of the uterus, and any other indicated procedures.   Pre-operative Testing -You will receive a phone call from presurgical testing at [facility] to discuss surgery instructions and arrange for lab work if needed.  -Bring your insurance card, copy of an advanced directive if applicable, medication list.  -You should not be taking blood thinners or aspirin at least ten days prior to surgery unless instructed by your surgeon.  -STOP FISH OIL AND TURMERIC NOW. Do not take supplements such as fish oil (omega 3), red yeast rice, turmeric before your surgery. You want to avoid medications with aspirin in them including headache powders such as BC or Goody's), Excedrin migraine.  Day Before Surgery at Home -You will be advised you can have clear liquids up until 3 hours before your surgery.    Your role in recovery Your role is to become active as soon as directed by your doctor, while still giving yourself time to heal.  Rest when you feel tired. You will be asked to do the following in order to speed your recovery:  - Cough and breathe deeply. This helps to clear and expand your lungs and can prevent pneumonia after surgery.  - STAY ACTIVE WHEN YOU GET HOME. Do mild physical activity. Walking or moving your legs help your circulation and body functions return to normal. Do not try to get up or walk alone the first time after surgery.   -If you develop swelling on one leg or the other, pain in the back of your leg, redness/warmth in one of your legs, please call the office or go to the Emergency Room to have a doppler to rule out a blood clot. For shortness of breath, chest pain-seek care in the Emergency Room as soon as possible. - Actively manage your pain. Managing  your pain lets you move in comfort. We will ask you to rate your pain on a scale of zero to 10. It is your responsibility to tell your doctor or nurse where and how much you hurt so your pain can be treated.  Special Considerations -Your final pathology results from surgery should be available around one week after surgery and the results will be relayed to you when available.  -FMLA forms can be faxed to [phone] and please allow 5-7 business days for completion.  Pain Management After Surgery -You will be prescribed your pain medication and bowel regimen medications before surgery so that you can have these available when you are discharged from the hospital. The pain medication is for use ONLY AFTER surgery and a new prescription will not be given.   -Make sure that you have Tylenol  and Ibuprofen  at home IF YOU ARE ABLE TO TAKE THESE MEDICATIONS to use on a regular basis after surgery for pain control. We recommend alternating the medications every hour to six hours since they work differently and are processed in the body differently for pain relief.  -Review the attached handout on narcotic use and their risks and side effects.   Bowel Regimen -You will be prescribed Sennakot-S to take nightly to prevent constipation especially if you are taking the narcotic pain medication intermittently.  It is important to prevent constipation and drink adequate amounts of liquids. You can stop taking this medication when you are not taking  pain medication and you are back on your normal bowel routine.  Risks of Surgery Risks of surgery are low but include bleeding, infection, damage to surrounding structures, re-operation, blood clots, and very rarely death.  AFTER SURGERY INSTRUCTIONS  Return to work:  variable based on occupation  We recommend purchasing several bags of frozen green peas and dividing them into ziploc bags. You will want to keep these in the freezer and have them ready to use as  ice packs to the vulvar incision. Once the ice pack is no longer cold, you can get another from the freezer. The frozen peas mold to your body better than a regular ice pack.   Activity: 1. Be up and out of the bed during the day.  Take a nap if needed.  You may walk up steps but be careful and use the hand rail.  Stair climbing will tire you more than you think, you may need to stop part way and rest.   2. No lifting or straining for 4 weeks over 10 pounds. No pushing, pulling, straining for 4 weeks.  3. No driving for minimum 24 hours after surgery.  Do not drive if you are taking narcotic pain medicine and make sure that your reaction time has returned.   4. You can shower as soon as the next day after surgery. Shower daily. No tub baths or submerging your body in water  until cleared by your surgeon for at least 4 weeks. If you have the soap that was given to you by pre-surgical testing that was used before surgery, you do not need to use it afterwards because this can irritate your incisions.   5. No sexual activity and nothing in the vagina for 4 weeks (until seen in the office).  6. You may experience vulvar spotting and discharge after surgery.  The spotting is normal but if you experience heavy bleeding, call our office.  7. Take Tylenol  or ibuprofen  first for pain if you are able to take these medications and only use narcotic pain medication for severe pain not relieved by the Tylenol  or Ibuprofen .  Monitor your Tylenol  intake to a max of 4,000 mg in a 24 hour period. You can alternate these medications after surgery.  Diet: 1. Low sodium Heart Healthy Diet is recommended but you are cleared to resume your normal (before surgery) diet after your procedure.  2. It is safe to use a laxative, such as Miralax or Colace, if you have difficulty moving your bowels. You have been prescribed Sennakot at bedtime every evening to keep bowel movements regular and to prevent constipation.    Wound  Care: 1. Keep clean and dry.  Shower daily.  Reasons to call the Doctor: Fever - Oral temperature greater than 100.4 degrees Fahrenheit Foul-smelling vaginal discharge Difficulty urinating Nausea and vomiting Increased pain at the site of the incision that is unrelieved with pain medicine. Difficulty breathing with or without chest pain New calf pain especially if only on one side Sudden, continuing increased vaginal bleeding with or without clots.   Contacts: For questions or concerns you should contact:  Dr. [name] at [phone]  Eleanor Epps, NP at [phone]  After Hours: call [phone] and have the GYN Oncologist paged/contacted (after 5 pm or on the weekends).  Messages sent via mychart are for non-urgent matters and are not responded to after hours so for urgent needs, please call the after hours number.

## 2023-04-07 NOTE — Progress Notes (Signed)
 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  We reviewed the nature of vulvar dysplasia. Surgical excision is the mainstay of treatment, but ablative therapy or pharmacologic treatment is an option for some patients in certain clinical scenarios. The goals of treatment of vulvar dysplasia are to prevent development of vulvar squamous carcinoma and relieve any associated symptoms, such as pain or itching. The goal is also preserve vulvar anatomy as best as possible.  Excision provides both treatment and a diagnostic specimen for those women with high-grade dysplasia. Invasive squamous cell carcinoma is present at the time of excision in 10-22% of women with VIN on initial biopsy.    She has elected to proceed with surgical excision.  We also reviewed her abnormal bleeding with recently obtain [facility] that is elevated into the postmenopausal range.  Given this, her bleeding might be categorized as postmenopausal bleeding, however, she has not gone 12 months without a menstrual cycle.  In the setting, we discussed consideration of a D&C at time of her vulvectomy procedure for pathologic evaluation of her endometrial lining.  She is in agreement with this.  Patient was consented for: Simple partial vulvectomy, dilation and curettage on 04/15/23.  The risks of surgery were discussed in detail and she understands these to including but not limited to bleeding requiring a blood transfusion, infection, injury to adjacent organs (including but not limited to the rectum, urethra, clitoris, nearby vessels and nerves), wound separation, unforseen complication, and possible need for re-exploration.  If the patient experiences any of these events, she understands that her hospitalization or recovery may be prolonged and that she may need to take  additional medications for a prolonged period. The patient will receive DVT and antibiotic prophylaxis as indicated. She voiced a clear understanding. She had the opportunity to ask questions and informed consent was obtained today. She wishes to proceed.  She does not require preoperative clearance. Her METs are >4.  All preoperative instructions were reviewed. Postoperative expectations were also reviewed. Written handouts were provided to the patient.   A copy of this note was sent to the patient's referring provider.  Clide Cliff, MD Gynecologic Oncology   Medical Decision Making I personally spent  TOTAL 50 minutes face-to-face and non-face-to-face in the care of this patient, which includes all pre, intra, and post visit time on the date of service.   ------------  CC: vulvar dysplasia  HISTORY OF PRESENT ILLNESS:  Peggy Obrien is a 53 y.o. woman who is seen in consultation at the request of Richardean Chimera, MD for evaluation of vulvar dysplasia.  Patient was seen 02/05/2023 for an annual exam at which time she was noted on exam to have 2 whitish areas on the right side of her perineal body, slightly thickened.  She returned on 02/12/2023 for biopsy.  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  Today patient presents with her husband.  Patient reports that she had not noticed any lesions prior to her biopsy.  Denies vulvar itching prior to her biopsies.  Otherwise pt notes ongoing menstrual cycles. She reports having an FSH drawn at the time of her annual exam that came back elevated at 58.6. She subsequently had a bleed in December.    PAST MEDICAL HISTORY: Past Medical History:  Diagnosis Date   Basal cell carcinoma    nose. inder left nostril  Disequilibrium syndrome    many years, chronic lightheadedness with occ period of vertigo, extensive w/u in [place] and then in [place], many meds tried, no definite dx-->seemed to "even out" per pt   Dizziness 2011   chronic   History of  adenomatous polyp of colon    recall 2025 Deboraha Sprang)   Menorrhagia    Lysteda as needed per GYN   Osteopenia    per GYN    PAST SURGICAL HISTORY: Past Surgical History:  Procedure Laterality Date   COLONOSCOPY W/ POLYPECTOMY  03/02/2019   2020 adenoma (Dr. Kinnie Scales) recall 2025   DEXA     01/2021 at GYN, T score -1.7   DILATATION & CURETTAGE/HYSTEROSCOPY WITH MYOSURE N/A 02/02/2016   Procedure: DILATATION & CURETTAGE/HYSTEROSCOPY WITH MYOSURE;  Surgeon: Ilda Mori, MD;  Location: [facility];  Service: Gynecology;  Laterality: N/A;   EYE SURGERY  2000   lasix   HYSTEROSCOPY WITH D & C  2020   LASER ABLATION OF THE CERVIX  1987   MOHS SURGERY  2025   MYRINGOTOMY Bilateral 1973   POLYPECTOMY N/A 02/02/2016   Procedure: POLYPECTOMY;  Surgeon: Ilda Mori, MD;  Location: [facility];  Service: Gynecology;  Laterality: N/A;   WISDOM TOOTH EXTRACTION  1989    OB/GYN HISTORY: OB History  Gravida Para Term Preterm AB Living  0 0 0 0 0 0  SAB IAB Ectopic Multiple Live Births  0 0 0 0 0      Age at menarche: 11 Age at menopause: still having menses Hx of HRT: OCPs in past Hx of STI: no Last pap: 2024 nml History of abnormal pap smears: 1987 treated with laser, normal since that time  SCREENING STUDIES:  Last mammogram: 01/2023 Last colonoscopy: 2020 (5 year follow-up)  MEDICATIONS:  Current Outpatient Medications:    Calcium 150 MG TABS, Take 300 mg by mouth daily., Disp: , Rfl:    Carboxymethylcellulose Sod PF (REFRESH PLUS) 0.5 % SOLN, Apply to eye., Disp: , Rfl:    cholecalciferol (VITAMIN D3) 25 MCG (1000 UNIT) tablet, Take 1,000 Units by mouth daily., Disp: , Rfl:    hydrOXYzine (VISTARIL) 25 MG capsule, Take 25 mg by mouth as needed for sleep., Disp: , Rfl:    loratadine (CLARITIN) 10 MG tablet, Take 10 mg by mouth daily., Disp: , Rfl:    Magnesium 300 MG CAPS, Take 300 mg by mouth daily., Disp: , Rfl:    Milk Thistle 150 MG CAPS, Take 150 mg by mouth daily., Disp: , Rfl:     Multiple Vitamins-Minerals (CENTRUM ADULTS) TABS, Take 1 tablet by mouth daily., Disp: , Rfl:    Omega-3 Fatty Acids (OMEGA 3 PO), Take 690 mg by mouth daily., Disp: , Rfl:    saccharomyces boulardii (FLORASTOR) 250 MG capsule, Take 250 mg by mouth daily as needed., Disp: , Rfl:    Turmeric 500 MG CAPS, Take 500 mg by mouth daily., Disp: , Rfl:    tranexamic acid (LYSTEDA) 650 MG TABS tablet, Take 2 tablets by mouth as needed for bleeding. (Patient not taking: Reported on 04/04/2023), Disp: , Rfl:   ALLERGIES: Allergies  Allergen Reactions   Codeine Nausea And Vomiting   Lorazepam Other (See Comments)    agitation   Xyrem [Oxybate] Nausea And Vomiting    FAMILY HISTORY: Family History  Problem Relation Age of Onset   High Cholesterol Mother    Multiple myeloma Father    Heart disease Father    High blood pressure  Father    Early death Brother    High blood pressure Brother    Breast cancer Maternal Grandmother    Early death Maternal Grandfather    Heart attack Maternal Grandfather    Diabetes Paternal Grandfather    Prostate cancer Neg Hx    Endometrial cancer Neg Hx    Ovarian cancer Neg Hx    Colon cancer Neg Hx    Pancreatic cancer Neg Hx     SOCIAL HISTORY: Social History   Socioeconomic History   Marital status: Married    Spouse name: Not on file   Number of children: Not on file   Years of education: Not on file   Highest education level: Master's degree (e.g., MA, MS, MEng, MEd, MSW, MBA)  Occupational History   Not on file  Tobacco Use   Smoking status: Never    Passive exposure: Never   Smokeless tobacco: Never  Vaping Use   Vaping status: Never Used  Substance and Sexual Activity   Alcohol use: Yes    Alcohol/week: 3.0 standard drinks of alcohol    Types: 3 Glasses of wine per week   Drug use: No   Sexual activity: Yes    Birth control/protection: None  Other Topics Concern   Not on file  Social History Narrative   Married, no children.    Orig from [place], in [place] since about 2009.   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   No tob.   Alc a few days a week, 1-2 drinks   Social Drivers of Corporate investment banker Strain: Low Risk  (06/19/2021)   Overall Financial Resource Strain (CARDIA)    Difficulty of Paying Living Expenses: Not hard at all  Food Insecurity: No Food Insecurity (06/19/2021)   Hunger Vital Sign    Worried About Running Out of Food in the Last Year: Never true    Ran Out of Food in the Last Year: Never true  Transportation Needs: No Transportation Needs (06/19/2021)   PRAPARE - Administrator, Civil Service (Medical): No    Lack of Transportation (Non-Medical): No  Physical Activity: Sufficiently Active (06/19/2021)   Exercise Vital Sign    Days of Exercise per Week: 6 days    Minutes of Exercise per Session: 60 min  Stress: No Stress Concern Present (06/19/2021)   [employer] of Occupational Health - Occupational Stress Questionnaire    Feeling of Stress : Not at all  Social Connections: Unknown (01/03/2023)   [facility]   Social Network    Social Network: Not on file  Intimate Partner Violence: Unknown (01/03/2023)   Received from [facility]   HITS    Physically Hurt: Not on file    Insult or Talk Down To: Not on file    Threaten Physical Harm: Not on file    Scream or Curse: Not on file    REVIEW OF SYSTEMS: New patient intake form was reviewed.  Complete 10-system review is negative except for the following: None  PHYSICAL EXAM: BP 131/85 (BP Location: Left Arm, Patient Position: Sitting)   Pulse 92   Temp 98.1 F (36.7 C) (Oral)   Resp 20   Ht 5\' 6"  (1.676 m)   Wt 131 lb 3.2 oz (59.5 kg)   SpO2 100%   BMI 21.18 kg/m  Constitutional: No acute distress. Neuro/Psych: Alert, oriented.  Head and Neck: Normocephalic, atraumatic. Neck symmetric  without masses. Sclera anicteric.  Respiratory: Normal  work of breathing. Clear to auscultation bilaterally. Cardiovascular: Regular rate and rhythm, no murmurs, rubs, or gallops. Abdomen: Normoactive bowel sounds. Soft, non-distended, non-tender to palpation. No masses appreciated.  Extremities: Grossly normal range of motion. Warm, well perfused. No edema bilaterally. Skin: No rashes or lesions. Lymphatic: No cervical, supraclavicular, or inguinal adenopathy. Genitourinary: External genitalia with slightly raised white lesions on the right posterior fourchette of the vulva.  Urethral meatus without lesions or prolapse. On speculum exam, vagina and cervix without lesions. Bimanual exam reveals normal cervix and uterus, mobile.  No adnexal masses. Exam chaperoned by Warner Mccreedy, NP  VULVAR COLPOSCOPY PROCEDURE NOTE  Procedure Details: After appropriate verbal informed consent was obtained, a timeout was performed. Acetic acid was applied to the vulva and the vulva was inspected with the colposcope with the findings as noted below. The vulva was then cleansed of the acetic acid with water. The patient tolerated the procedure well.   Adequate Exam: yes  Biopsy Specimen: none  Condition: Stable. Patient tolerated procedure well.  Complications: None  Findings: aceotwhite changes of the right posterior fourchette and extending onto perineal body. 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  Outside laboratory, pathology were reviewed, with pertinent results below.    WBC  Date Value Ref Range Status  04/16/2022 5.2 4.0 - 10.5 K/uL Final   Hemoglobin  Date Value Ref Range Status  04/16/2022 14.5 12.0 - 15.0 g/dL Final   HCT  Date Value Ref Range Status  04/16/2022 42.1 36.0 - 46.0 % Final   Platelets  Date Value Ref Range Status  04/16/2022 244.0  150.0 - 400.0 K/uL Final   Creatinine, Ser  Date Value Ref Range Status  04/16/2022 0.58 0.40 - 1.20 mg/dL Final   AST  Date Value Ref Range Status  04/16/2022 30 0 - 37 U/L Final   ALT  Date Value Ref Range Status  04/16/2022 35 0 - 35 U/L Final   CHL HPV  Date Value Ref Range Status  01/29/2021 Negative  Final    Surgical pathology (02/12/2023): Vulva, biopsy, lesion: High-grade squamous intraepithelial lesion (VIN 2-3, high-grade dysplasia)

## 2023-04-07 NOTE — Progress Notes (Signed)
 Patient here for a pre-operative appointment prior to her scheduled surgery on 04/15/2023. She is scheduled for a pelvic examination under anesthesia, simple partial vulvectomy, dilation and curettage of the uterus, and any other indicated procedures. The surgery was discussed in detail.  See after visit summary for additional details.     Discussed post-op pain management in detail including the aspects of the enhanced recovery pathway.  Advised her that a new prescription would be sent in for Tramadol  and it is only to be used for after her upcoming surgery.  We discussed the use of tylenol  post-op and to monitor for a maximum of 4,000 mg in a 24 hour period.  Also prescribed sennakot to be used after surgery and to hold if having loose stools.  Discussed bowel regimen in detail.     Discussed the use of SCDs and measures to take at home to prevent DVT including frequent mobility.  Reportable signs and symptoms of DVT discussed. Post-operative instructions discussed and expectations for after surgery. Incisional care discussed as well including reportable signs and symptoms including erythema, drainage, wound separation.     30 minutes spent with the patient.  Verbalizing understanding of material discussed. No needs or concerns voiced at the end of the visit.   Advised patient to call for any needs.  Advised that her post-operative medications had been prescribed and could be picked up at any time.    This appointment is included in the global surgical bundle as pre-operative teaching and has no charge.

## 2023-04-10 NOTE — Progress Notes (Addendum)
COVID Vaccine received:  []  No [x]  Yes Date of any COVID positive Test in last 90 days: none  PCP - Nicoletta Ba, MD  Cardiologist - None  Chest x-ray -  EKG -  no hx to warrant  Stress Test -  ECHO -  Cardiac Cath -   PCR screen: []  Ordered & Completed []   No Order but Needs PROFEND     [x]   N/A for this surgery  Surgery Plan:  [x]  Ambulatory   []  Outpatient in bed  []  Admit Anesthesia:    []  General  []  Spinal  [x]   Choice []   MAC  Bowel Prep - [x]  No  []   Yes ____Gyn Diet__  Pacemaker / ICD device [x]  No []  Yes   Spinal Cord Stimulator:[x]  No []  Yes       History of Sleep Apnea? [x]  No []  Yes   CPAP used?- [x]  No []  Yes    Does the patient monitor blood sugar?   [x]  N/A   []  No []  Yes  Patient has: [x]  NO Hx DM   []  Pre-DM   []  DM1  []   DM2  Blood Thinner / Instructions:  none Aspirin Instructions:  none  ERAS Protocol Ordered: []  No  [x]  Yes PRE-SURGERY []  ENSURE  []  G2   [x]  No Drink Ordered Patient is to be NPO after: 0430  Dental hx: []  Dentures:  [x]  N/A      []  Bridge or Partial:                   []  Loose or Damaged teeth:   Comments: when I went over her hx she said she has Mitral Valve prolapse. has no cardiologist here, had and ECHO back in 2017ish according to [facility]. She also said she was seen by a cardiologist approx 20 years ago in [place] for it. She says she always takes antibx prior to dental work. I notified Warner Mccreedy, NP and Terance Hart, PA that I would be requesting this ECHO for [facility].   Initially, I did not order an EKG, but I will order one for DOS in light of this additional information and the possibility of getting cardiac clearance.   [facility] sent back just office notes, no ECHO report was sent.   Activity level: Patient is able climb a flight of stairs without difficulty; [x]  No CP  [x]  No SOB.  Patient can perform ADLs without assistance.   Anesthesia review: anxiety, chronic dizziness- has been worked up but no reason  found.   Patient denies shortness of breath, fever, cough and chest pain at PAT appointment.  Patient verbalized understanding and agreement to the Pre-Surgical Instructions that were given to them at this PAT appointment. Patient was also educated of the need to review these PAT instructions again prior to her surgery.I reviewed the appropriate phone numbers to call if they have any and questions or concerns.

## 2023-04-10 NOTE — Patient Instructions (Signed)
SURGICAL WAITING ROOM VISITATION Patients having surgery or a procedure may have no more than 2 support people in the waiting area - these visitors may rotate in the visitor waiting room.   Due to an increase in RSV and influenza rates and associated hospitalizations, children ages 108 and under may not visit patients in [facility] hospitals. If the patient needs to stay at the hospital during part of their recovery, the visitor guidelines for inpatient rooms apply.  PRE-OP VISITATION  Pre-op nurse will coordinate an appropriate time for 1 support person to accompany the patient in pre-op.  This support person may not rotate.  This visitor will be contacted when the time is appropriate for the visitor to come back in the pre-op area.  Please refer to the [facility] website for the visitor guidelines for Inpatients (after your surgery is over and you are in a regular room).  You are not required to quarantine at this time prior to your surgery. However, you must do this: Hand Hygiene often Do NOT share personal items Notify your provider if you are in close contact with someone who has COVID or you develop fever 100.4 or greater, new onset of sneezing, cough, sore throat, shortness of breath or body aches.  If you test positive for Covid or have been in contact with anyone that has tested positive in the last 10 days please notify you surgeon.    Your procedure is scheduled on:  Tuesday  April 15, 2023  Report to [facility] Main Entrance: [facility] entrance where the [employer] is available.   Report to admitting at: 05:15    AM  Call this number if you have any questions or problems the morning of surgery [phone]  Do not eat food after Midnight the night prior to your surgery/procedure.  After Midnight you may have the following liquids until  04:30 AM DAY OF SURGERY  Clear Liquid Diet Water Black Coffee (sugar ok, NO MILK/CREAM OR CREAMERS)  Tea (sugar ok, NO  MILK/CREAM OR CREAMERS) regular and decaf                             Plain Jell-O  with no fruit (NO RED)                                           Fruit ices (not with fruit pulp, NO RED)                                     Popsicles (NO RED)                                                                  Juice: NO CITRUS JUICES: only apple, WHITE grape, WHITE cranberry Sports drinks like Gatorade or Powerade (NO RED)               FOLLOW ANY ADDITIONAL PRE OP INSTRUCTIONS YOU RECEIVED FROM YOUR SURGEON'S OFFICE!!!   Oral Hygiene is also important to reduce your risk of  infection.        Remember - BRUSH YOUR TEETH THE MORNING OF SURGERY WITH YOUR REGULAR TOOTHPASTE  Do NOT smoke after Midnight the night before surgery.  STOP TAKING all Vitamins, Herbs and supplements 1 week before your surgery.   Take ONLY these medicines the morning of surgery with A SIP OF WATER: loratadine                   You may not have any metal on your body including hair pins, jewelry, and body piercing  Do not wear make-up, lotions, powders, perfumes or deodorant  Do not wear nail polish including gel and S&S, artificial / acrylic nails, or any other type of covering on natural nails including finger and toenails. If you have artificial nails, gel coating, etc., that needs to be removed by a nail salon, Please have this removed prior to surgery. Not doing so may mean that your surgery could be cancelled or delayed if the Surgeon or anesthesia staff feels like they are unable to monitor you safely.   Do not shave 48 hours prior to surgery to avoid nicks in your skin which may contribute to postoperative infections.   Contacts, Hearing Aids, dentures or bridgework may not be worn into surgery. DENTURES WILL BE REMOVED PRIOR TO SURGERY PLEASE DO NOT APPLY "Poly grip" OR ADHESIVES!!!  Patients discharged on the day of surgery will not be allowed to drive home.  Someone NEEDS to stay with you for the first 24  hours after anesthesia.  Do not bring your home medications to the hospital. The Pharmacy will dispense medications listed on your medication list to you during your admission in the Hospital.  Please read over the following fact sheets you were given: IF YOU HAVE QUESTIONS ABOUT YOUR PRE-OP INSTRUCTIONS, PLEASE CALL [phone]   [facility] - Preparing for Surgery Before surgery, you can play an important role.  Because skin is not sterile, your skin needs to be as free of germs as possible.  You can reduce the number of germs on your skin by washing with CHG (chlorahexidine gluconate) soap before surgery.  CHG is an antiseptic cleaner which kills germs and bonds with the skin to continue killing germs even after washing. Please DO NOT use if you have an allergy to CHG or antibacterial soaps.  If your skin becomes reddened/irritated stop using the CHG and inform your nurse when you arrive at Short Stay. Do not shave (including legs and underarms) for at least 48 hours prior to the first CHG shower.  You may shave your face/neck.  Please follow these instructions carefully:  1.  Shower with CHG Soap the night before surgery and the  morning of surgery.  2.  If you choose to wash your hair, wash your hair first as usual with your normal  shampoo.  3.  After you shampoo, rinse your hair and body thoroughly to remove the shampoo.                             4.  Use CHG as you would any other liquid soap.  You can apply chg directly to the skin and wash.  Gently with a scrungie or clean washcloth.  5.  Apply the CHG Soap to your body ONLY FROM THE NECK DOWN.   Do not use on face/ open  Wound or open sores. Avoid contact with eyes, ears mouth and genitals (private parts).                       Wash face,  Genitals (private parts) with your normal soap.             6.  Wash thoroughly, paying special attention to the area where your  surgery  will be performed.  7.   Thoroughly rinse your body with warm water from the neck down.  8.  DO NOT shower/wash with your normal soap after using and rinsing off the CHG Soap.            9.  Pat yourself dry with a clean towel.            10.  Wear clean pajamas.            11.  Place clean sheets on your bed the night of your first shower and do not  sleep with pets.  ON THE DAY OF SURGERY : Do not apply any lotions/deodorants the morning of surgery.  Please wear clean clothes to the hospital/surgery center.     FAILURE TO FOLLOW THESE INSTRUCTIONS MAY RESULT IN THE CANCELLATION OF YOUR SURGERY  PATIENT SIGNATURE_________________________________  NURSE SIGNATURE__________________________________  ________________________________________________________________________

## 2023-04-11 ENCOUNTER — Encounter (HOSPITAL_COMMUNITY): Payer: Self-pay

## 2023-04-11 ENCOUNTER — Encounter (HOSPITAL_COMMUNITY)
Admission: RE | Admit: 2023-04-11 | Discharge: 2023-04-11 | Disposition: A | Discharge status: Home / Self Care | Payer: 59 | Source: Ambulatory Visit | Attending: Psychiatry | Admitting: Psychiatry

## 2023-04-11 ENCOUNTER — Other Ambulatory Visit: Payer: Self-pay

## 2023-04-11 VITALS — BP 119/80 | HR 74 | Temp 98.2°F | Resp 20 | Ht 66.0 in | Wt 130.0 lb

## 2023-04-11 DIAGNOSIS — D071 Carcinoma in situ of vulva: Secondary | ICD-10-CM | POA: Diagnosis present

## 2023-04-11 DIAGNOSIS — I341 Nonrheumatic mitral (valve) prolapse: Secondary | ICD-10-CM | POA: Diagnosis not present

## 2023-04-11 DIAGNOSIS — Z01812 Encounter for preprocedural laboratory examination: Secondary | ICD-10-CM | POA: Diagnosis present

## 2023-04-11 DIAGNOSIS — N939 Abnormal uterine and vaginal bleeding, unspecified: Secondary | ICD-10-CM | POA: Diagnosis not present

## 2023-04-11 DIAGNOSIS — Z01818 Encounter for other preprocedural examination: Secondary | ICD-10-CM

## 2023-04-11 HISTORY — DX: Nonrheumatic mitral (valve) prolapse: I34.1

## 2023-04-11 HISTORY — DX: Anxiety disorder, unspecified: F41.9

## 2023-04-11 LAB — CBC
HCT: 43.5 % (ref 36.0–46.0)
Hemoglobin: 14.5 g/dL (ref 12.0–15.0)
MCH: 31.5 pg (ref 26.0–34.0)
MCHC: 33.3 g/dL (ref 30.0–36.0)
MCV: 94.6 fL (ref 80.0–100.0)
Platelets: 313 10*3/uL (ref 150–400)
RBC: 4.6 MIL/uL (ref 3.87–5.11)
RDW: 11.5 % (ref 11.5–15.5)
WBC: 5.8 10*3/uL (ref 4.0–10.5)
nRBC: 0 % (ref 0.0–0.2)

## 2023-04-11 LAB — BASIC METABOLIC PANEL
Anion gap: 8 (ref 5–15)
BUN: 16 mg/dL (ref 6–20)
CO2: 29 mmol/L (ref 22–32)
Calcium: 9.4 mg/dL (ref 8.9–10.3)
Chloride: 99 mmol/L (ref 98–111)
Creatinine, Ser: 0.36 mg/dL — ABNORMAL LOW (ref 0.44–1.00)
GFR, Estimated: >60 mL/min (ref >60)
Glucose, Bld: 103 mg/dL — ABNORMAL HIGH (ref 70–99)
Potassium: 3.9 mmol/L (ref 3.5–5.1)
Sodium: 136 mmol/L (ref 135–145)

## 2023-04-14 ENCOUNTER — Encounter (HOSPITAL_COMMUNITY): Payer: Self-pay

## 2023-04-14 ENCOUNTER — Encounter: Payer: Self-pay | Admitting: Obstetrics and Gynecology

## 2023-04-14 ENCOUNTER — Telehealth: Payer: Self-pay | Admitting: Surgery

## 2023-04-14 NOTE — Progress Notes (Signed)
Case: [id number] Date/Time: 04/15/23 0715   Procedures:      PELVIC EXAM UNDER ANESTHESIA     SIMPLE VULVECTOMY PARTIAL     DILATATION AND CURETTAGE   Anesthesia type: Choice   Pre-op diagnosis: vin 3   Location: [facility] ROOM 05 / [facility]   Surgeons: Clide Cliff, MD        DISCUSSION: Peggy Obrien is a 53 year old female who presents to PAT prior to surgery above.  Past medical history significant for MVP, chronic dizziness, VIN and AUB.  Prior anesthesia complications include PONV.  Patient with reported hx of MVP and saw cardiology ~20 years ago in [facility]. No echo in system. She has seen her PCP for chest pain in 2017 which was deemed to be costochondritis. No mention of MVP or heart murmur in most recent PCP's office notes.  Patient without any concerning cardiac symptoms and has good functional status. Anticipate she can proceed  VS: BP 119/80 Comment: left arm sitting  Pulse 74   Temp 36.8 C (Oral)   Resp 20   Ht 5\' 6"  (1.676 m)   Wt 59 kg   SpO2 100%   BMI 20.98 kg/m   PROVIDERS: McGowen, Maryjean Morn, MD   LABS: Labs reviewed: Acceptable for surgery. (all labs ordered are listed, but only abnormal results are displayed)  Labs Reviewed  BASIC METABOLIC PANEL - Abnormal; Notable for the following components:      Result Value   Glucose, Bld 103 (*)    Creatinine, Ser 0.36 (*)    All other components within normal limits  CBC     IMAGES:   EKG:   CV:  Past Medical History:  Diagnosis Date   Anxiety    Basal cell carcinoma    nose. inder left nostril x2,  Moh's surgery   Disequilibrium syndrome    many years, chronic lightheadedness with occ period of vertigo, extensive w/u in [place] and then in [place], many meds tried, no definite dx-->seemed to "even out" per pt   Dizziness 2011   chronic   History of adenomatous polyp of colon    recall 2025 ([facility])   Menorrhagia    Lysteda as needed per GYN   Mitral valve prolapse    no surgery,  Last ECHO was 2017,   takes antibx before surgery   Osteopenia    per GYN   Pneumonia 1977    Past Surgical History:  Procedure Laterality Date   COLONOSCOPY W/ POLYPECTOMY  03/02/2019   2020 adenoma (Dr. Kinnie Scales) recall 2025   DEXA     01/2021 at GYN, T score -1.7   DILATATION & CURETTAGE/HYSTEROSCOPY WITH MYOSURE N/A 02/02/2016   Procedure: DILATATION & CURETTAGE/HYSTEROSCOPY WITH MYOSURE;  Surgeon: Ilda Mori, MD;  Location: [facility];  Service: Gynecology;  Laterality: N/A;   EYE SURGERY  2000   lasix   HYSTEROSCOPY WITH D & C  2020   LASER ABLATION OF THE CERVIX  1987   MOHS SURGERY  2025   MYRINGOTOMY Bilateral 1973   POLYPECTOMY N/A 02/02/2016   Procedure: POLYPECTOMY;  Surgeon: Ilda Mori, MD;  Location: [facility];  Service: Gynecology;  Laterality: N/A;   WISDOM TOOTH EXTRACTION  1989    MEDICATIONS:  Calcium 150 MG TABS   Carboxymethylcellulose Sod PF (REFRESH PLUS) 0.5 % SOLN   cholecalciferol (VITAMIN D3) 25 MCG (1000 UNIT) tablet   hydrOXYzine (VISTARIL) 25 MG capsule   ibuprofen (ADVIL) 800 MG tablet   loratadine (CLARITIN) 10 MG  tablet   Magnesium 300 MG CAPS   Milk Thistle 150 MG CAPS   Multiple Vitamins-Minerals (CENTRUM ADULTS) TABS   Omega-3 Fatty Acids (OMEGA 3 PO)   saccharomyces boulardii (FLORASTOR) 250 MG capsule   senna-docusate (SENEXON-S) 8.6-50 MG tablet   traMADol (ULTRAM) 50 MG tablet   tranexamic acid (LYSTEDA) 650 MG TABS tablet   Turmeric 500 MG CAPS   No current facility-administered medications for this encounter.   Marcille Blanco [facility] Short Stay/Anesthesiology [facility] Phone [phone] 04/14/2023 9:29 AM

## 2023-04-14 NOTE — Anesthesia Preprocedure Evaluation (Signed)
Anesthesia Evaluation  Patient identified by MRN, date of birth, ID band Patient awake    Reviewed: Allergy & Precautions, NPO status , Patient's Chart, lab work & pertinent test results  Airway Mallampati: II  TM Distance: >3 FB Neck ROM: Full    Dental  (+) Teeth Intact, Dental Advisory Given   Pulmonary    breath sounds clear to auscultation       Cardiovascular + Valvular Problems/Murmurs MVP  Rhythm:Regular Rate:Normal     Neuro/Psych   Anxiety      Neuromuscular disease    GI/Hepatic negative GI ROS, Neg liver ROS,,,  Endo/Other  negative endocrine ROS    Renal/GU negative Renal ROS     Musculoskeletal negative musculoskeletal ROS (+)    Abdominal   Peds  Hematology negative hematology ROS (+)   Anesthesia Other Findings   Reproductive/Obstetrics                             Anesthesia Physical Anesthesia Plan  ASA: 2  Anesthesia Plan: General   Post-op Pain Management: Tylenol PO (pre-op)* and Toradol IV (intra-op)*   Induction: Intravenous  PONV Risk Score and Plan: 4 or greater and Ondansetron, Dexamethasone, Midazolam and Scopolamine patch - Pre-op  Airway Management Planned: Oral ETT  Additional Equipment: None  Intra-op Plan:   Post-operative Plan: Extubation in OR  Informed Consent: I have reviewed the patients History and Physical, chart, labs and discussed the procedure including the risks, benefits and alternatives for the proposed anesthesia with the patient or authorized representative who has indicated his/her understanding and acceptance.     Dental advisory given  Plan Discussed with: CRNA  Anesthesia Plan Comments: (See PAT note from 1/17 by Sherlie Ban PA-C )        Anesthesia Quick Evaluation

## 2023-04-14 NOTE — Telephone Encounter (Signed)
Telephone call to check on pre-operative status.  Patient compliant with pre-operative instructions.  Reinforced nothing to eat after midnight. Clear liquids until 4:15am. Patient to arrive at 5:15am. Verified that post-op medications have been sent to patient's preferred pharmacy.  No questions or concerns voiced.  Instructed to call for any needs. 

## 2023-04-15 ENCOUNTER — Other Ambulatory Visit: Payer: Self-pay

## 2023-04-15 ENCOUNTER — Encounter (HOSPITAL_COMMUNITY): Payer: Self-pay | Admitting: Psychiatry

## 2023-04-15 ENCOUNTER — Ambulatory Visit (HOSPITAL_BASED_OUTPATIENT_CLINIC_OR_DEPARTMENT_OTHER): Payer: 59 | Admitting: Anesthesiology

## 2023-04-15 ENCOUNTER — Encounter (HOSPITAL_COMMUNITY)
Admission: RE | Disposition: A | Discharge status: Home / Self Care | Payer: Self-pay | Source: Home / Self Care | Attending: Psychiatry

## 2023-04-15 ENCOUNTER — Ambulatory Visit (HOSPITAL_COMMUNITY): Payer: 59 | Admitting: Medical

## 2023-04-15 ENCOUNTER — Ambulatory Visit (HOSPITAL_COMMUNITY)
Admission: RE | Admit: 2023-04-15 | Discharge: 2023-04-15 | Disposition: A | Discharge status: Home / Self Care | Payer: 59 | Attending: Psychiatry | Admitting: Psychiatry

## 2023-04-15 DIAGNOSIS — D071 Carcinoma in situ of vulva: Secondary | ICD-10-CM

## 2023-04-15 DIAGNOSIS — N939 Abnormal uterine and vaginal bleeding, unspecified: Secondary | ICD-10-CM | POA: Diagnosis not present

## 2023-04-15 DIAGNOSIS — Z85828 Personal history of other malignant neoplasm of skin: Secondary | ICD-10-CM | POA: Insufficient documentation

## 2023-04-15 DIAGNOSIS — M8580 Other specified disorders of bone density and structure, unspecified site: Secondary | ICD-10-CM | POA: Diagnosis not present

## 2023-04-15 HISTORY — PX: VULVECTOMY PARTIAL: SHX6187

## 2023-04-15 HISTORY — PX: DILATION AND CURETTAGE OF UTERUS: SHX78

## 2023-04-15 LAB — POCT PREGNANCY, URINE: Preg Test, Ur: NEGATIVE

## 2023-04-15 SURGERY — EXAM UNDER ANESTHESIA
Anesthesia: General

## 2023-04-15 MED ORDER — LACTATED RINGERS IV SOLN: INTRAVENOUS | Status: DC | PRN

## 2023-04-15 MED ORDER — ACETAMINOPHEN 160 MG/5ML PO SOLN: 325.0000 mg | ORAL | Status: DC | PRN

## 2023-04-15 MED ORDER — LIDOCAINE HCL (PF) 1 % IJ SOLN
INTRAMUSCULAR | Status: AC
Start: 2023-04-15 — End: ?
  Filled 2023-04-15: qty 30

## 2023-04-15 MED ORDER — MIDAZOLAM HCL 2 MG/2ML IJ SOLN
INTRAMUSCULAR | Status: AC
  Filled 2023-04-15: qty 2

## 2023-04-15 MED ORDER — ACETAMINOPHEN 10 MG/ML IV SOLN: 1000.0000 mg | Freq: Once | INTRAVENOUS | Status: DC | PRN

## 2023-04-15 MED ORDER — SILVER NITRATE-POT NITRATE 75-25 % EX MISC
CUTANEOUS | Status: DC | PRN
  Administered 2023-04-15: 1

## 2023-04-15 MED ORDER — ACETAMINOPHEN 500 MG PO TABS
1000.0000 mg | ORAL_TABLET | ORAL | Status: AC
  Administered 2023-04-15: 1000 mg via ORAL
  Filled 2023-04-15: qty 2

## 2023-04-15 MED ORDER — ORAL CARE MOUTH RINSE: 15.0000 mL | Freq: Once | OROMUCOSAL | Status: AC

## 2023-04-15 MED ORDER — SCOPOLAMINE 1 MG/3DAYS TD PT72
1.0000 | MEDICATED_PATCH | TRANSDERMAL | Status: DC
  Administered 2023-04-15: 1.5 mg via TRANSDERMAL
  Filled 2023-04-15: qty 1

## 2023-04-15 MED ORDER — ACETIC ACID 5 % SOLN
Status: DC | PRN
  Administered 2023-04-15: 1 via TOPICAL

## 2023-04-15 MED ORDER — OXYCODONE HCL 5 MG/5ML PO SOLN: 5.0000 mg | Freq: Once | ORAL | Status: AC | PRN

## 2023-04-15 MED ORDER — OXYCODONE HCL 5 MG PO TABS
5.0000 mg | ORAL_TABLET | Freq: Once | ORAL | Status: AC | PRN
  Administered 2023-04-15: 5 mg via ORAL

## 2023-04-15 MED ORDER — LIDOCAINE HCL (CARDIAC) PF 100 MG/5ML IV SOSY
PREFILLED_SYRINGE | INTRAVENOUS | Status: DC | PRN
  Administered 2023-04-15: 80 mg via INTRATRACHEAL

## 2023-04-15 MED ORDER — ACETIC ACID 5 % SOLN
Status: AC
  Filled 2023-04-15: qty 50

## 2023-04-15 MED ORDER — SILVER NITRATE-POT NITRATE 75-25 % EX MISC
CUTANEOUS | Status: AC
  Filled 2023-04-15: qty 10

## 2023-04-15 MED ORDER — LIDOCAINE HCL (PF) 2 % IJ SOLN
INTRAMUSCULAR | Status: AC
  Filled 2023-04-15: qty 10

## 2023-04-15 MED ORDER — DEXAMETHASONE SODIUM PHOSPHATE 10 MG/ML IJ SOLN
INTRAMUSCULAR | Status: DC | PRN
  Administered 2023-04-15: 5 mg via INTRAVENOUS

## 2023-04-15 MED ORDER — GABAPENTIN 300 MG PO CAPS
300.0000 mg | ORAL_CAPSULE | ORAL | Status: AC
  Administered 2023-04-15: 300 mg via ORAL
  Filled 2023-04-15: qty 1

## 2023-04-15 MED ORDER — PROPOFOL 10 MG/ML IV BOLUS
INTRAVENOUS | Status: AC
  Filled 2023-04-15: qty 20

## 2023-04-15 MED ORDER — DEXAMETHASONE SODIUM PHOSPHATE 4 MG/ML IJ SOLN: 4.0000 mg | INTRAMUSCULAR | Status: DC

## 2023-04-15 MED ORDER — DROPERIDOL 2.5 MG/ML IJ SOLN: 0.6250 mg | Freq: Once | INTRAMUSCULAR | Status: DC | PRN

## 2023-04-15 MED ORDER — FENTANYL CITRATE (PF) 100 MCG/2ML IJ SOLN
INTRAMUSCULAR | Status: DC | PRN
  Administered 2023-04-15 (×2): 25 ug via INTRAVENOUS

## 2023-04-15 MED ORDER — BUPIVACAINE HCL (PF) 0.25 % IJ SOLN
INTRAMUSCULAR | Status: DC | PRN
  Administered 2023-04-15: 10 mL

## 2023-04-15 MED ORDER — FENTANYL CITRATE (PF) 100 MCG/2ML IJ SOLN
INTRAMUSCULAR | Status: AC
  Filled 2023-04-15: qty 2

## 2023-04-15 MED ORDER — CHLORHEXIDINE GLUCONATE 0.12 % MT SOLN
15.0000 mL | Freq: Once | OROMUCOSAL | Status: AC
  Administered 2023-04-15: 15 mL via OROMUCOSAL

## 2023-04-15 MED ORDER — DEXAMETHASONE SODIUM PHOSPHATE 10 MG/ML IJ SOLN
INTRAMUSCULAR | Status: AC
  Filled 2023-04-15: qty 1

## 2023-04-15 MED ORDER — LACTATED RINGERS IV SOLN: INTRAVENOUS | Status: DC

## 2023-04-15 MED ORDER — PROPOFOL 500 MG/50ML IV EMUL
INTRAVENOUS | Status: DC | PRN
  Administered 2023-04-15: 25 ug/kg/min via INTRAVENOUS

## 2023-04-15 MED ORDER — FENTANYL CITRATE PF 50 MCG/ML IJ SOSY
25.0000 ug | PREFILLED_SYRINGE | INTRAMUSCULAR | Status: DC | PRN
  Administered 2023-04-15 (×2): 25 ug via INTRAVENOUS

## 2023-04-15 MED ORDER — BUPIVACAINE HCL 0.25 % IJ SOLN
INTRAMUSCULAR | Status: AC
  Filled 2023-04-15: qty 1

## 2023-04-15 MED ORDER — ACETAMINOPHEN 325 MG PO TABS: 325.0000 mg | ORAL_TABLET | ORAL | Status: DC | PRN

## 2023-04-15 MED ORDER — PROPOFOL 10 MG/ML IV BOLUS
INTRAVENOUS | Status: DC | PRN
  Administered 2023-04-15: 150 mg via INTRAVENOUS

## 2023-04-15 MED ORDER — FENTANYL CITRATE PF 50 MCG/ML IJ SOSY
PREFILLED_SYRINGE | INTRAMUSCULAR | Status: AC
  Filled 2023-04-15: qty 1

## 2023-04-15 MED ORDER — ONDANSETRON HCL 4 MG/2ML IJ SOLN
INTRAMUSCULAR | Status: DC | PRN
  Administered 2023-04-15: 4 mg via INTRAVENOUS

## 2023-04-15 MED ORDER — MIDAZOLAM HCL 5 MG/5ML IJ SOLN
INTRAMUSCULAR | Status: DC | PRN
  Administered 2023-04-15: 2 mg via INTRAVENOUS

## 2023-04-15 MED ORDER — ONDANSETRON HCL 4 MG/2ML IJ SOLN
INTRAMUSCULAR | Status: AC
  Filled 2023-04-15: qty 2

## 2023-04-15 MED ORDER — STERILE WATER FOR IRRIGATION IR SOLN
Status: DC | PRN
  Administered 2023-04-15: 1000 mL

## 2023-04-15 MED ORDER — OXYCODONE HCL 5 MG PO TABS
ORAL_TABLET | ORAL | Status: AC
  Filled 2023-04-15: qty 1

## 2023-04-15 SURGICAL SUPPLY — 18 items
BLADE SURG 15 STRL LF DISP TIS (BLADE) ×1
BLADE SURG SZ11 CARB STEEL (BLADE)
CATH ROBINSON RED A/P 16FR (CATHETERS) ×1
GAUZE 4X4 16PLY ~~LOC~~+RFID DBL (SPONGE) ×1
GLOVE BIO SURGEON STRL SZ 6 (GLOVE) ×2
KIT TURNOVER KIT A (KITS) ×1
NDL HYPO 22X1.5 SAFETY MO (MISCELLANEOUS) IMPLANT
NDL HYPO 25X1 1.5 SAFETY (NEEDLE) IMPLANT
NEEDLE HYPO 22X1.5 SAFETY MO (MISCELLANEOUS) ×1
NEEDLE HYPO 25X1 1.5 SAFETY (NEEDLE)
PACK PERINEAL COLD (PAD) ×1
PACK VAGINAL WOMENS (CUSTOM PROCEDURE TRAY) ×1
SUT VIC AB 2-0 CT1 TAPERPNT 27 (SUTURE) ×1
SUT VIC AB 3-0 SH 27X BRD (SUTURE) ×1
SUT VIC AB 4-0 PS2 18 (SUTURE) ×1
SYR BULB IRRIG 60ML STRL (SYRINGE) ×1
TOWEL OR 17X26 10 PK STRL BLUE (TOWEL DISPOSABLE) ×2
WATER STERILE IRR 1000ML POUR (IV SOLUTION)

## 2023-04-15 NOTE — Anesthesia Procedure Notes (Signed)
Procedure Name: LMA Insertion Date/Time: 04/15/2023 7:55 AM  Performed by: Deri Fuelling, CRNAPre-anesthesia Checklist: Patient identified, Emergency Drugs available, Suction available and Patient being monitored Patient Re-evaluated:Patient Re-evaluated prior to induction Oxygen Delivery Method: Circle system utilized Preoxygenation: Pre-oxygenation with 100% oxygen Induction Type: IV induction Ventilation: Mask ventilation without difficulty LMA: LMA inserted LMA Size: 4.0 Tube type: Oral Number of attempts: 1 Airway Equipment and Method: Stylet and Oral airway Placement Confirmation: ETT inserted through vocal cords under direct vision, positive ETCO2 and breath sounds checked- equal and bilateral Tube secured with: Tape Dental Injury: Teeth and Oropharynx as per pre-operative assessment

## 2023-04-15 NOTE — Interval H&P Note (Signed)
History and Physical Interval Note:  04/15/2023 7:25 AM  Peggy Obrien  has presented today for surgery, with the diagnosis of vin 3.  The various methods of treatment have been discussed with the patient and family. After consideration of risks, benefits and other options for treatment, the patient has consented to  Procedure(s): PELVIC EXAM UNDER ANESTHESIA (N/A) SIMPLE VULVECTOMY PARTIAL (N/A) DILATATION AND CURETTAGE (N/A) as a surgical intervention.  The patient's history has been reviewed, patient examined, no change in status, stable for surgery.  I have reviewed the patient's chart and labs.  Questions were answered to the patient's satisfaction.     Claudina Oliphant

## 2023-04-15 NOTE — Op Note (Signed)
GYNECOLOGIC ONCOLOGY OPERATIVE NOTE  Date of Service: 04/15/2023  Preoperative Diagnosis: VIN3, AUB  Postoperative Diagnosis: Same  Procedures: Simple partial vulvectomy, dilation and curettage  Surgeon: Clide Cliff, MD  Assistants: None  Anesthesia: LMA  Estimated Blood Loss: 20 mL    Urine Output: 150 ml, clear yellow  Findings: External genitalia with slightly raised lesion of the right posterior fourchette and perineal body. This area with acetowhite changes with acetic acid. No additional areas of concern. Normal appearing cervix. Uterus sounds to approximately 7cm. Gritty texture in all quadrants at conclusion.  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  Prior to the procedure, all risks, benefits, and alternatives were discussed and informed surgical consent was signed.  Procedure: Patient was taken to the operating room where general anesthesia was achieved.  She was positioned in dorsal lithotomy and prepped and draped.    Given location of vulvectomy, with closure potentially put on stretch with open speculum, decision made to perform D&C portion of procedure first. Speculum was placed in the vagina. A tenaculum was placed on the anterior lip of the cervix. The cervix was serially dilated with pratt dilators. A curette was inserted into the endometrial cavity and a curettage was performed until a gritty texture was achieved in all quadrants. The tenaculum was removed. Silver nitrate was applied and hemostasis achieved. Speculum was removed.  A thorough exam of the vulva was done after  placing a moist sponge with acetic acid on the vulva with the findings as noted above. The area to be removed was outlined with a marking pen. A scalpel was used to incise around the lesion. Allis clamps are used to grasp the lesion and a skinning vulvectomy was performed in the standard fashion with aid of cautery. After the lesion was removed, the wound bed was made hemostatic with cautery. Several deep 2-0 vicryl suture was placed to bring the wound edges together followed by 3-0 vicryl interrupted stitches in a deep dermal layer and 4-0 vicryl mattress stitches on the skin edges to re-appoximate the wound. Hemostasis was noted at the end of the procedure. 0.25% marcaine was injected into the incision in standard fashion. And in an out catheterization was performed with clear yellow urine drained.  Patient tolerated the procedure well. Sponge, lap, and instrument counts were correct.  No perioperative antibiotics were indicated for this procedure.  Patient was extubated and taken to the PACU in stable condition.  Clide Cliff, MD Gynecologic Oncology

## 2023-04-15 NOTE — Transfer of Care (Signed)
Immediate Anesthesia Transfer of Care Note  Patient: Peggy Obrien  Procedure(s) Performed: PELVIC EXAM UNDER ANESTHESIA SIMPLE VULVECTOMY PARTIAL DILATATION AND CURETTAGE  Patient Location: PACU  Anesthesia Type:General  Level of Consciousness: awake and alert   Airway & Oxygen Therapy: Patient Spontanous Breathing and Patient connected to face mask oxygen  Post-op Assessment: Report given to RN and Post -op Vital signs reviewed and stable  Post vital signs: Reviewed and stable  Last Vitals:  Vitals Value Taken Time  BP 154/133 04/15/23 0851  Temp    Pulse 85 04/15/23 0853  Resp 12 04/15/23 0853  SpO2 100 % 04/15/23 0853  Vitals shown include unfiled device data.  Last Pain:  Vitals:   04/15/23 0624  TempSrc:   PainSc: 0-No pain      Patients Stated Pain Goal: 5 (04/15/23 2956)  Complications: No notable events documented.

## 2023-04-15 NOTE — Anesthesia Postprocedure Evaluation (Signed)
Anesthesia Post Note  Patient: Peggy Obrien  Procedure(s) Performed: PELVIC EXAM UNDER ANESTHESIA SIMPLE VULVECTOMY PARTIAL DILATATION AND CURETTAGE     Patient location during evaluation: PACU Anesthesia Type: General Level of consciousness: awake and alert Pain management: pain level controlled Vital Signs Assessment: post-procedure vital signs reviewed and stable Respiratory status: spontaneous breathing, nonlabored ventilation, respiratory function stable and patient connected to nasal cannula oxygen Cardiovascular status: blood pressure returned to baseline and stable Postop Assessment: no apparent nausea or vomiting Anesthetic complications: no  No notable events documented.  Last Vitals:  Vitals:   04/15/23 1000 04/15/23 1023  BP: 134/86 (!) 142/88  Pulse: 92 74  Resp: 13   Temp: 36.4 C 36.5 C  SpO2: 100% 100%    Last Pain:  Vitals:   04/15/23 1023  TempSrc:   PainSc: 1                  Shelton Silvas

## 2023-04-15 NOTE — Discharge Instructions (Addendum)
04/15/2023   Activity: 1. Be up and out of the bed during the day.  Take a nap if needed.  You may walk up steps but be careful and use the hand rail.  Stair climbing will tire you more than you think, you may need to stop part way and rest.   2. No lifting or straining over 10 lbs, pushing, pulling, straining for 6 weeks.  3. Do not drive if you are taking narcotic pain medicine. You need to make sure your reaction time has returned and you can brake safely.  4. Shower daily.  Use your regular soap to bathe and when finished pat your incision dry; don't rub.  No tub baths until cleared by your surgeon.   5. No sexual activity and nothing in the vagina for 2 weeks.  6. You may experience a small amount of clear drainage from your incisions, which is normal.  If the drainage persists or increases, please call the office.  7. You may experience vaginal spotting after surgery or around the 6-8 week mark from surgery when the stitches at the top of the vagina begin to dissolve.  The spotting is normal but if you experience heavy bleeding, call our office.  8. Take Tylenol or ibuprofen first for pain and only use narcotic pain medication for severe pain not relieved by the Tylenol or Ibuprofen.  Monitor your Tylenol intake to a max of 4,000 mg.   Diet: 1. Low sodium Heart Healthy Diet is recommended.  2. It is safe to use a laxative, such as Miralax or Colace, if you have difficulty moving your bowels. You can take Sennakot at bedtime every evening to keep bowel movements regular and to prevent constipation.    Wound Care: 1. Keep clean and dry.  Shower daily.  Reasons to call the Doctor: Fever - Oral temperature greater than 100.4 degrees Fahrenheit Foul-smelling vaginal discharge Difficulty urinating Nausea and vomiting Increased pain at the site of the incision that is unrelieved with pain medicine. Difficulty breathing with or without chest pain New calf pain especially if only on  one side Sudden, continuing increased vaginal bleeding with or without clots.   Contacts: For questions or concerns you should contact:  Dr. Alvester Morin at [phone]  Warner Mccreedy, NP at [phone]  After Hours: call [phone] and have the GYN Oncologist paged/contacted

## 2023-04-16 ENCOUNTER — Telehealth: Payer: Self-pay | Admitting: *Deleted

## 2023-04-16 ENCOUNTER — Encounter (HOSPITAL_COMMUNITY): Payer: Self-pay | Admitting: Psychiatry

## 2023-04-16 NOTE — Telephone Encounter (Signed)
Spoke with Peggy Obrien this morning. She states she is eating, drinking and urinating well. She has not had a BM yet but is passing gas. She is taking senokot as prescribed and encouraged her to drink plenty of water. She denies fever or chills. Incisions are dry and intact. She rates her pain 3/10. Her pain is controlled with tylenol and ibuprofen.     Instructed to call office with any fever, chills, purulent drainage, uncontrolled pain or any other questions or concerns. Patient verbalizes understanding.   Pt aware of post op appointments as well as the office number [phone] and after hours number [phone] to call if she has any questions or concerns

## 2023-04-17 ENCOUNTER — Telehealth: Payer: Self-pay | Admitting: *Deleted

## 2023-04-17 LAB — SURGICAL PATHOLOGY

## 2023-04-17 NOTE — Telephone Encounter (Signed)
Spoke with patient who called the office back and said she had a good BM and wanted to know if she should take another dose of miralax today?  Pt advised to take another dose of miralax today, keep herself well hydrated and take 2 senokot tonight. Also advised if pt has another BM by morning to hold off on the miralax and take as needed and continue with senokot at bedtime until BM's are regular. Pt verbalized understanding and thanked the office.

## 2023-04-17 NOTE — Telephone Encounter (Signed)
Spoke with patient after she called and left a message. Pt states she hasn't had a bowel movement since Monday, but however had a very small stool this morning, but still feels like it's not enough. Pt denies abdominal pain and or distention. Pt states she is passing gas. She also reports that she was only taking one senokot at bedtime and realized today the instructions were to take two at bedtime. Once realizing this she took 2 senokot this morning.   Pt advised she can continue to take 2 senokot in the morning and at bedtime and also add miralax today, 1 capful dose in 8 oz of water or juice twice daily. Stop for loose stools and add back if needed and adjust according to the consistency of the stools. Pt verbalized understanding and advised the office would call her tomorrow to check in. Pt thanked the office for calling back.

## 2023-04-22 ENCOUNTER — Encounter: Payer: Self-pay | Admitting: Psychiatry

## 2023-04-28 ENCOUNTER — Inpatient Hospital Stay: Payer: 59 | Attending: Psychiatry | Admitting: Psychiatry

## 2023-04-28 VITALS — BP 138/83 | HR 81 | Temp 98.3°F | Resp 16 | Wt 131.6 lb

## 2023-04-28 DIAGNOSIS — Z9079 Acquired absence of other genital organ(s): Secondary | ICD-10-CM

## 2023-04-28 DIAGNOSIS — N939 Abnormal uterine and vaginal bleeding, unspecified: Secondary | ICD-10-CM | POA: Diagnosis not present

## 2023-04-28 DIAGNOSIS — D071 Carcinoma in situ of vulva: Secondary | ICD-10-CM | POA: Diagnosis present

## 2023-04-28 DIAGNOSIS — Z7189 Other specified counseling: Secondary | ICD-10-CM

## 2023-04-28 MED ORDER — SENNOSIDES-DOCUSATE SODIUM 8.6-50 MG PO TABS: 2.0000 | ORAL_TABLET | Freq: Every day | ORAL | 0 refills | Status: DC

## 2023-04-28 NOTE — Progress Notes (Unsigned)
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  Postop: - Pt recovering well from surgery and healing appropriately postoperatively - Intraoperative findings and pathology results reviewed. - Ongoing postoperative expectations and precautions reviewed. Continue with no lifting >10lbs through 6 weeks postoperatively - Pt works ***. Okay to return to work at [employer] - Given that uterus is in situ, pt advised that she should continue with pap smear screening per routine until age 4 if she continues with negative/low grade paps.  - Reviewed that after 12 months without menstrual cycles, she should not have any spotting or bleeding.  If this were to occur, she should be evaluated for postmenopausal bleeding.  VIN3: - Reviewed final pathology in detail. - No evidence of invasive cancer. Negative margins for dysplasia.  - Reviewed signs/symptoms of recurrence.  - Surveillance reviewed. Follow-up q6 months x2 years then annually.   AUB:   RTC ***.  Clide Cliff, MD Gynecologic Oncology   Medical Decision Making I personally spent  TOTAL *** minutes face-to-face and non-face-to-face in the care of this patient, which includes all pre, intra, and post visit time on the date of service. The discussion of *** is beyond the scope of routine postoperative care.   ----------------------- Reason for Visit: Postop/Treatment counseling  Treatment History: ***  Interval History: Pt reports that she is recovering well from surgery. She is using ibuprofen BID for pain. She is eating and drinking well. She is voiding without issue. Doing better with bowel movements, continuing on senna. No bleeding anymore.   Past Medical/Surgical History: Past Medical History:  Diagnosis Date   Anxiety    Basal cell  carcinoma    nose. inder left nostril x2,  Moh's surgery   Disequilibrium syndrome    many years, chronic lightheadedness with occ period of vertigo, extensive w/u in [place] and then in [place], many meds tried, no definite dx-->seemed to "even out" per pt   Dizziness 2011   chronic   History of adenomatous polyp of colon    recall 2025 ([facility])   Menorrhagia    Lysteda as needed per GYN   Mitral valve prolapse    no surgery,  Last ECHO was 2017,  takes antibx before surgery   Osteopenia    per GYN   Pneumonia 1977    Past Surgical History:  Procedure Laterality Date   COLONOSCOPY W/ POLYPECTOMY  03/02/2019   2020 adenoma (Dr. Kinnie Scales) recall 2025   DEXA     01/2021 at GYN, T score -1.7   DILATATION & CURETTAGE/HYSTEROSCOPY WITH MYOSURE N/A 02/02/2016   Procedure: DILATATION & CURETTAGE/HYSTEROSCOPY WITH MYOSURE;  Surgeon: Ilda Mori, MD;  Location: [facility];  Service: Gynecology;  Laterality: N/A;   DILATION AND CURETTAGE OF UTERUS N/A 04/15/2023   Endometrial bx benign. Procedure: DILATATION AND CURETTAGE;  Surgeon: Clide Cliff, MD;  Location: [facility];  Service: Gynecology;  Laterality: N/A;   EYE SURGERY  2000   lasix   HYSTEROSCOPY WITH D & C  2020   LASER ABLATION OF THE CERVIX  1987   MOHS SURGERY  2025   MYRINGOTOMY Bilateral 1973   POLYPECTOMY N/A 02/02/2016   Procedure: POLYPECTOMY;  Surgeon: Ilda Mori, MD;  Location: [facility];  Service: Gynecology;  Laterality: N/A;   VULVECTOMY PARTIAL N/A 04/15/2023  Procedure: SIMPLE VULVECTOMY PARTIAL;  Surgeon: Clide Cliff, MD;  Location: [facility];  Service: Gynecology;  Laterality: N/A;   WISDOM TOOTH EXTRACTION  1989    Family History  Problem Relation Age of Onset   High Cholesterol Mother    Multiple myeloma Father    Heart disease Father    High blood pressure Father    Early death Brother    High blood pressure Brother    Breast cancer Maternal Grandmother    Early death Maternal Grandfather    Heart  attack Maternal Grandfather    Diabetes Paternal Grandfather    Prostate cancer Neg Hx    Endometrial cancer Neg Hx    Ovarian cancer Neg Hx    Colon cancer Neg Hx    Pancreatic cancer Neg Hx     Social History   Socioeconomic History   Marital status: Married    Spouse name: Not on file   Number of children: Not on file   Years of education: Not on file   Highest education level: Master's degree (e.g., MA, MS, MEng, MEd, MSW, MBA)  Occupational History   Not on file  Tobacco Use   Smoking status: Never    Passive exposure: Never   Smokeless tobacco: Never  Vaping Use   Vaping status: Never Used  Substance and Sexual Activity   Alcohol use: Yes    Alcohol/week: 3.0 standard drinks of alcohol    Types: 3 Glasses of wine per week   Drug use: No   Sexual activity: Yes    Birth control/protection: None  Other Topics Concern   Not on file  Social History Narrative   Married, no children.   Orig from [place], in [place] since about 2009.   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   No tob.   Alc a few days a week, 1-2 drinks   Social Drivers of Corporate investment banker Strain: Low Risk  (06/19/2021)   Overall Financial Resource Strain (CARDIA)    Difficulty of Paying Living Expenses: Not hard at all  Food Insecurity: No Food Insecurity (06/19/2021)   Hunger Vital Sign    Worried About Running Out of Food in the Last Year: Never true    Ran Out of Food in the Last Year: Never true  Transportation Needs: No Transportation Needs (06/19/2021)   PRAPARE - Administrator, Civil Service (Medical): No    Lack of Transportation (Non-Medical): No  Physical Activity: Sufficiently Active (06/19/2021)   Exercise Vital Sign    Days of Exercise per Week: 6 days    Minutes of Exercise per Session: 60 min  Stress: No Stress Concern Present (06/19/2021)   [employer] of Occupational Health - Occupational Stress  Questionnaire    Feeling of Stress : Not at all  Social Connections: Unknown (01/03/2023)   Received from [facility]   Social Network    Social Network: Not on file    Current Medications:  Current Outpatient Medications:    loratadine (CLARITIN) 10 MG tablet, Take 10 mg by mouth daily., Disp: , Rfl:    Magnesium 300 MG CAPS, Take 300 mg by mouth daily., Disp: , Rfl:    Milk Thistle 150 MG CAPS, Take 150 mg by mouth daily., Disp: , Rfl:    Multiple Vitamins-Minerals (CENTRUM ADULTS) TABS, Take 1 tablet by mouth daily., Disp: , Rfl:    Omega-3 Fatty Acids (OMEGA 3 PO),  Take 690 mg by mouth daily., Disp: , Rfl:    saccharomyces boulardii (FLORASTOR) 250 MG capsule, Take 250 mg by mouth daily as needed., Disp: , Rfl:    senna-docusate (SENEXON-S) 8.6-50 MG tablet, Take 2 tablets by mouth at bedtime. For AFTER surgery, do not take if having diarrhea, Disp: 30 tablet, Rfl: 0   tranexamic acid (LYSTEDA) 650 MG TABS tablet, Take 2 tablets by mouth as needed for bleeding., Disp: , Rfl:    Turmeric 500 MG CAPS, Take 500 mg by mouth daily., Disp: , Rfl:    Calcium 150 MG TABS, Take 300 mg by mouth daily., Disp: , Rfl:    Carboxymethylcellulose Sod PF (REFRESH PLUS) 0.5 % SOLN, Place 1 drop into both eyes daily as needed (dry eyes)., Disp: , Rfl:    cholecalciferol (VITAMIN D3) 25 MCG (1000 UNIT) tablet, Take 1,000 Units by mouth daily., Disp: , Rfl:    hydrOXYzine (VISTARIL) 25 MG capsule, Take 25 mg by mouth as needed for sleep., Disp: , Rfl:    ibuprofen (ADVIL) 800 MG tablet, Take 1 tablet (800 mg total) by mouth every 8 (eight) hours as needed for moderate pain (pain score 4-6). For AFTER surgery only, Disp: 30 tablet, Rfl: 0   traMADol (ULTRAM) 50 MG tablet, Take 1 tablet (50 mg total) by mouth every 6 (six) hours as needed for severe pain (pain score 7-10). For AFTER surgery only, do not take and drive (Patient not taking: Reported on 04/23/2023), Disp: 10 tablet, Rfl: 0  Review of  Symptoms: Complete 10-system review is negative except as above in Interval History.  Physical Exam: LMP 02/27/2023 Comment: Pregnancy test neg (-) on 04/15/2023 General: Alert, oriented, no acute distress. HEENT: Normocephalic, atraumatic.  Chest: Normal work of breathing.  Abdomen: Soft, nontender. Extremities: Grossly normal range of motion.  Warm, well perfused.  No edema bilaterally. Skin: No rashes or lesions noted. GU: External genitalia with recent right posterior vulvectomy, healing well, almost completely healed up. Few stitches still visible. Exam chaperoned by Kimberly [place], CMA   Laboratory & Radiologic Studies: Surgical pathology (04/15/23): A. ENDOMETRIAL, CURETTINGS:       Benign inactive endometrium with stromal breakdown.       Negative for hyperplasia, atypia or malignancy.   B. VULVA, POSTERIOR 11 O'CLOCK, BIOPSY:       High-grade squamous intraepithelial lesion (HSIL / VIN 3).       Surgical margins of resection are negative for dysplasia.       Negative for invasive carcinoma.

## 2023-04-28 NOTE — Patient Instructions (Signed)
It was a pleasure to see you in clinic today. - Healing well, restrictions lifted. - Return visit planned for 6 mo  Thank you very much for allowing me to provide care for you today.  I appreciate your confidence in choosing our Gynecologic Oncology team at [facility]  If you have any questions about your visit today please call our office or send [place] a MyChart message and we will get back to you as soon as possible.

## 2023-04-29 ENCOUNTER — Encounter: Payer: Self-pay | Admitting: Obstetrics and Gynecology

## 2023-04-29 ENCOUNTER — Ambulatory Visit: Payer: 59 | Admitting: Family Medicine

## 2023-04-29 ENCOUNTER — Encounter: Payer: Self-pay | Admitting: Family Medicine

## 2023-04-29 VITALS — BP 129/80 | HR 87 | Temp 98.6°F | Ht 67.0 in | Wt 129.2 lb

## 2023-04-29 DIAGNOSIS — Z1159 Encounter for screening for other viral diseases: Secondary | ICD-10-CM

## 2023-04-29 DIAGNOSIS — Z0000 Encounter for general adult medical examination without abnormal findings: Secondary | ICD-10-CM | POA: Diagnosis not present

## 2023-04-29 LAB — COMPREHENSIVE METABOLIC PANEL
ALT: 46 U/L — ABNORMAL HIGH (ref 0–35)
AST: 34 U/L (ref 0–37)
Albumin: 4.6 g/dL (ref 3.5–5.2)
Alkaline Phosphatase: 74 U/L (ref 39–117)
BUN: 15 mg/dL (ref 6–23)
CO2: 32 meq/L (ref 19–32)
Calcium: 9.4 mg/dL (ref 8.4–10.5)
Chloride: 100 meq/L (ref 96–112)
Creatinine, Ser: 0.59 mg/dL (ref 0.40–1.20)
GFR: 102.24 mL/min (ref >60.00)
Glucose, Bld: 84 mg/dL (ref 70–99)
Potassium: 4.1 meq/L (ref 3.5–5.1)
Sodium: 140 meq/L (ref 135–145)
Total Bilirubin: 0.5 mg/dL (ref 0.2–1.2)
Total Protein: 7.2 g/dL (ref 6.0–8.3)

## 2023-04-29 LAB — CBC
HCT: 41 % (ref 36.0–46.0)
Hemoglobin: 14 g/dL (ref 12.0–15.0)
MCHC: 34.1 g/dL (ref 30.0–36.0)
MCV: 95.1 fL (ref 78.0–100.0)
Platelets: 372 10*3/uL (ref 150.0–400.0)
RBC: 4.31 Mil/uL (ref 3.87–5.11)
RDW: 12.3 % (ref 11.5–15.5)
WBC: 7.1 10*3/uL (ref 4.0–10.5)

## 2023-04-29 LAB — HEMOGLOBIN A1C: Hgb A1c MFr Bld: 5.6 % (ref 4.6–6.5)

## 2023-04-29 LAB — LIPID PANEL
Cholesterol: 170 mg/dL (ref 0–200)
HDL: 81.4 mg/dL (ref >39.00)
LDL Cholesterol: 79 mg/dL (ref 0–99)
NonHDL: 88.81
Total CHOL/HDL Ratio: 2
Triglycerides: 48 mg/dL (ref 0.0–149.0)
VLDL: 9.6 mg/dL (ref 0.0–40.0)

## 2023-04-29 LAB — TSH: TSH: 2.58 u[IU]/mL (ref 0.35–5.50)

## 2023-04-29 NOTE — Progress Notes (Signed)
 Office Note 04/29/2023  CC:  Chief Complaint  Patient presents with   Annual Exam    HPI:  Patient is a 53 y.o. female who is here for annual health maintenance exam. Overall feeling well but had some recent issues: Some basal cell carcinomas excised from her nose. She had recent partial vulvectomy for dysplastic biopsy, also D&C--> benign.  Her tendency to get discomfort in the left chest wall has not bothered her over the last month or so.  Past Medical History:  Diagnosis Date   Anxiety    Basal cell carcinoma    nose. inder left nostril x2,  Moh's surgery   Disequilibrium syndrome    many years, chronic lightheadedness with occ period of vertigo, extensive w/u in [place]  and then in [place], many meds tried, no definite dx-->seemed to even out per pt   Dizziness 2011   chronic   History of adenomatous polyp of colon    recall 2025 ([facility])   Menorrhagia    Lysteda as needed per GYN   Mitral valve prolapse    no surgery,  Last ECHO was 2017,  takes antibx before surgery   Osteopenia    per GYN   Pneumonia 1977    Past Surgical History:  Procedure Laterality Date   COLONOSCOPY W/ POLYPECTOMY  03/02/2019   2020 adenoma (Dr. Luis) recall 2025   DEXA     01/2021 at GYN, T score -1.7   DILATATION & CURETTAGE/HYSTEROSCOPY WITH MYOSURE N/A 02/02/2016   Procedure: DILATATION & CURETTAGE/HYSTEROSCOPY WITH MYOSURE;  Surgeon: Charlie Aho, MD;  Location: [facility];  Service: Gynecology;  Laterality: N/A;   DILATION AND CURETTAGE OF UTERUS N/A 04/15/2023   Endometrial bx benign. Procedure: DILATATION AND CURETTAGE;  Surgeon: Eldonna Mays, MD;  Location[facility];  Service: Gynecology;  Laterality: N/A;   EYE SURGERY  2000   lasix   HYSTEROSCOPY WITH D & C  2020   LASER ABLATION OF THE CERVIX  1987   MOHS SURGERY  2025   MYRINGOTOMY Bilateral 1973   POLYPECTOMY N/A 02/02/2016   Procedure: POLYPECTOMY;  Surgeon: Charlie Aho, MD;  Location: [facility];  Service: Gynecology;   Laterality: N/A;   VULVECTOMY PARTIAL N/A 04/15/2023   Procedure: SIMPLE VULVECTOMY PARTIAL;  Surgeon: Eldonna Mays, MD;  Location[facility];  Service: Gynecology;  Laterality: N/A;   WISDOM TOOTH EXTRACTION  1989    Family History  Problem Relation Age of Onset   High Cholesterol Mother    Multiple myeloma Father    Heart disease Father    High blood pressure Father    Early death Brother    High blood pressure Brother    Breast cancer Maternal Grandmother    Early death Maternal Grandfather    Heart attack Maternal Grandfather    Diabetes Paternal Grandfather    Prostate cancer Neg Hx    Endometrial cancer Neg Hx    Ovarian cancer Neg Hx    Colon cancer Neg Hx    Pancreatic cancer Neg Hx     Social History   Socioeconomic History   Marital status: Married    Spouse name: Not on file   Number of children: Not on file   Years of education: Not on file   Highest education level: Master's degree (e.g., MA, MS, MEng, MEd, MSW, MBA)  Occupational History   Not on file  Tobacco Use   Smoking status: Never    Passive exposure: Never   Smokeless tobacco: Never  Vaping Use   Vaping status: Never Used  Substance and Sexual Activity   Alcohol use: Yes    Alcohol/week: 3.0 standard drinks of alcohol    Types: 3 Glasses of wine per week   Drug use: No   Sexual activity: Yes    Birth control/protection: None  Other Topics Concern   Not on file  Social History Narrative   Married, no children.   [place]ig from [place] , in [place] about 2009.   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   No tob.   Alc a few days a week, 1-2 drinks   Social Drivers of Corporate Investment Banker Strain: Low Risk  (06/19/2021)   Overall Financial Resource Strain (CARDIA)    Difficulty of Paying Living Expenses: Not hard at all  Food Insecurity: No Food Insecurity (06/19/2021)   Hunger Vital Sign    Worried About Running Out of Food in the  Last Year: Never true    Ran Out of Food in the Last Year: Never true  Transportation Needs: No Transportation Needs (06/19/2021)   PRAPARE - Administrator, Civil Service (Medical): No    Lack of Transportation (Non-Medical): No  Physical Activity: Sufficiently Active (06/19/2021)   Exercise Vital Sign    Days of Exercise per Week: 6 days    Minutes of Exercise per Session: 60 min  Stress: No Stress Concern Present (06/19/2021)   [employer] of Occupational Health - Occupational Stress Questionnaire    Feeling of Stress : Not at all  Social Connections: Unknown (01/03/2023)   [facility]   Social Network    Social Network: Not on file  Intimate Partner Violence: Unknown (01/03/2023)   Received from [facility]   HITS    Physically Hurt: Not on file    Insult or Talk Down To: Not on file    Threaten Physical Harm: Not on file    Scream or Curse: Not on file    Outpatient Medications Prior to Visit  Medication Sig Dispense Refill   Calcium 150 MG TABS Take 300 mg by mouth daily.     Carboxymethylcellulose Sod PF (REFRESH PLUS) 0.5 % SOLN Place 1 drop into both eyes daily as needed (dry eyes).     hydrOXYzine (VISTARIL) 25 MG capsule Take 25 mg by mouth as needed for sleep.     ibuprofen  (ADVIL ) 800 MG tablet Take 1 tablet (800 mg total) by mouth every 8 (eight) hours as needed for moderate pain (pain score 4-6). For AFTER surgery only 30 tablet 0   loratadine (CLARITIN) 10 MG tablet Take 10 mg by mouth daily.     Magnesium 300 MG CAPS Take 300 mg by mouth daily.     Milk Thistle 150 MG CAPS Take 150 mg by mouth daily.     Multiple Vitamins-Minerals (CENTRUM ADULTS) TABS Take 1 tablet by mouth daily.     saccharomyces boulardii (FLORASTOR) 250 MG capsule Take 250 mg by mouth daily as needed.     senna-docusate (SENEXON-S) 8.6-50 MG tablet Take 2 tablets by mouth at bedtime. Do not take if having diarrhea 30 tablet 0   tranexamic acid (LYSTEDA) 650  MG TABS tablet Take 2 tablets by mouth as needed for bleeding.     Turmeric 500 MG CAPS Take 500 mg by mouth daily.     cholecalciferol (VITAMIN D3) 25 MCG (1000 UNIT) tablet Take 1,000 Units by  mouth daily.     Omega-3 Fatty Acids (OMEGA 3 PO) Take 690 mg by mouth daily.     traMADol  (ULTRAM ) 50 MG tablet Take 1 tablet (50 mg total) by mouth every 6 (six) hours as needed for severe pain (pain score 7-10). For AFTER surgery only, do not take and drive (Patient not taking: Reported on 04/23/2023) 10 tablet 0   No facility-administered medications prior to visit.    Allergies  Allergen Reactions   Codeine Nausea And Vomiting   Lorazepam Other (See Comments)    agitation   Xyrem [Oxybate] Nausea And Vomiting    Review of Systems  Constitutional:  Negative for appetite change, chills, fatigue and fever.  HENT:  Negative for congestion, dental problem, ear pain and sore throat.   Eyes:  Negative for discharge, redness and visual disturbance.  Respiratory:  Negative for cough, chest tightness, shortness of breath and wheezing.   Cardiovascular:  Negative for chest pain, palpitations and leg swelling.  Gastrointestinal:  Negative for abdominal pain, blood in stool, diarrhea, nausea and vomiting.  Genitourinary:  Negative for difficulty urinating, dysuria, flank pain, frequency, hematuria and urgency.  Musculoskeletal:  Negative for arthralgias, back pain, joint swelling, myalgias and neck stiffness.  Skin:  Negative for pallor and rash.  Neurological:  Negative for dizziness, speech difficulty, weakness and headaches.  Hematological:  Negative for adenopathy. Does not bruise/bleed easily.  Psychiatric/Behavioral:  Negative for confusion and sleep disturbance. The patient is not nervous/anxious.     PE;    04/29/2023    8:10 AM 04/28/2023    3:15 PM 04/15/2023   10:23 AM  Vitals with BMI  Height 5' 7    Weight 129 lbs 3 oz 131 lbs 10 oz   BMI 20.23 21.25   Systolic 129 138 857   Diastolic 80 83 88  Pulse 87 81 74  Exam chaperoned by Shanda Pizza, CMA  Gen: Alert, well appearing.  Patient is oriented to person, place, time, and situation. AFFECT: pleasant, lucid thought and speech. ENT: Ears: EACs clear, normal epithelium.  TMs with good light reflex and landmarks bilaterally.  Eyes: no injection, icteris, swelling, or exudate.  EOMI, PERRLA. Nose: no drainage or turbinate edema/swelling.  No injection or focal lesion.  Mouth: lips without lesion/swelling.  Oral mucosa pink and moist.  Dentition intact and without obvious caries or gingival swelling.  Oropharynx without erythema, exudate, or swelling.  Neck: supple/nontender.  No LAD, mass, or TM.  Carotid pulses 2+ bilaterally, without bruits. CV: RRR, no m/r/g.   LUNGS: CTA bilat, nonlabored resps, good aeration in all lung fields. ABD: soft, NT, ND, BS normal.  No hepatospenomegaly or mass.  No bruits. EXT: no clubbing, cyanosis, or edema.  Musculoskeletal: no joint swelling, erythema, warmth, or tenderness.  ROM of all joints intact except mild impairment of internal rotation of the right shoulder.  Additionally, she has mild medial protrusion of the left scapula and mild anterior protrusion of the left anterior lower rib cage. Skin - no sores or suspicious lesions or rashes or color changes  Pertinent labs:  Lab Results  Component Value Date   TSH 3.14 04/16/2022   Lab Results  Component Value Date   WBC 5.8 04/11/2023   HGB 14.5 04/11/2023   HCT 43.5 04/11/2023   MCV 94.6 04/11/2023   PLT 313 04/11/2023   Lab Results  Component Value Date   CREATININE 0.36 (L) 04/11/2023   BUN 16 04/11/2023   NA 136 04/11/2023  K 3.9 04/11/2023   CL 99 04/11/2023   CO2 29 04/11/2023   Lab Results  Component Value Date   ALT 35 04/16/2022   AST 30 04/16/2022   ALKPHOS 64 04/16/2022   BILITOT 0.5 04/16/2022   Lab Results  Component Value Date   CHOL 172 04/16/2022   Lab Results  Component Value Date    HDL 87.10 04/16/2022   Lab Results  Component Value Date   LDLCALC 78 04/16/2022   Lab Results  Component Value Date   TRIG 36.0 04/16/2022   Lab Results  Component Value Date   CHOLHDL 2 04/16/2022   Lab Results  Component Value Date   HGBA1C 5.6 04/16/2022   ASSESSMENT AND PLAN:   #1 health maintenance exam: Reviewed age and gender appropriate health maintenance issues (prudent diet, regular exercise, health risks of tobacco and excessive alcohol, use of seatbelts, fire alarms in home, use of sunscreen).  Also reviewed age and gender appropriate health screening as well as vaccine recommendations. Vaccines: Flu-->UTD.  All up-to-date Labs: Fasting health panel today Cervical ca screening: UTD with GYN MD. Breast ca screening: Mammogram up-to-date 01/2023, she'll be getting breast MRI soon (routine screening for her). Colon ca screening: recall 2025 (Dr. Tonette is going to push this out until 2026.  #2 chronic left chest wall bony protrusion, intermittent discomfort in the area. Imaging has been negative in the past. She plans on seeing sports medicine physician if her discomfort returns.  An After Visit Summary was printed and given to the patient.  FOLLOW UP:  Return in about 1 year (around 04/28/2024) for annual CPE (fasting).  Signed:  Gerlene Hockey, MD           04/29/2023

## 2023-04-29 NOTE — Patient Instructions (Signed)

## 2023-04-30 ENCOUNTER — Other Ambulatory Visit: Payer: Self-pay

## 2023-04-30 ENCOUNTER — Encounter: Payer: Self-pay | Admitting: Family Medicine

## 2023-04-30 ENCOUNTER — Other Ambulatory Visit: Payer: 59

## 2023-04-30 DIAGNOSIS — R7401 Elevation of levels of liver transaminase levels: Secondary | ICD-10-CM

## 2023-04-30 NOTE — Telephone Encounter (Signed)
(  Vanessa -->please add vitamin D  level.  You can use the diagnosis of vitamin D  deficiency.)  Hi Ermie, The mildly elevated ALT is not too worrisome.  It could be from the Tylenol  and ibuprofen . I always check hepatitis B and C antibody levels in this instance, just to be extra thorough.   These results are pending. I will have vitamin D  level added. --PM

## 2023-05-01 LAB — HEPATITIS B SURFACE ANTIGEN: Hepatitis B Surface Ag: NONREACTIVE

## 2023-05-01 LAB — HEPATITIS B CORE ANTIBODY, IGM: Hep B C IgM: NONREACTIVE

## 2023-05-01 NOTE — Telephone Encounter (Signed)
 Lab added

## 2023-05-02 ENCOUNTER — Encounter: Payer: Self-pay | Admitting: Family Medicine

## 2023-05-02 ENCOUNTER — Other Ambulatory Visit: Payer: Self-pay | Admitting: Family Medicine

## 2023-05-02 DIAGNOSIS — R7401 Elevation of levels of liver transaminase levels: Secondary | ICD-10-CM

## 2023-05-02 LAB — HEPATITIS C ANTIBODY: Hepatitis C Ab: NONREACTIVE

## 2023-05-02 LAB — VITAMIN D 25 HYDROXY (VIT D DEFICIENCY, FRACTURES): Vit D, 25-Hydroxy: 55 ng/mL (ref 30–100)

## 2023-05-02 NOTE — Telephone Encounter (Signed)
 No further action needed.

## 2023-05-04 ENCOUNTER — Encounter: Payer: Self-pay | Admitting: Psychiatry

## 2023-05-27 ENCOUNTER — Other Ambulatory Visit (INDEPENDENT_AMBULATORY_CARE_PROVIDER_SITE_OTHER): Payer: 59

## 2023-05-27 DIAGNOSIS — R7401 Elevation of levels of liver transaminase levels: Secondary | ICD-10-CM

## 2023-05-28 ENCOUNTER — Encounter: Payer: Self-pay | Admitting: Family Medicine

## 2023-05-28 LAB — HEPATIC FUNCTION PANEL
ALT: 22 U/L (ref 0–35)
AST: 23 U/L (ref 0–37)
Albumin: 4.4 g/dL (ref 3.5–5.2)
Alkaline Phosphatase: 81 U/L (ref 39–117)
Bilirubin, Direct: 0.1 mg/dL (ref 0.0–0.3)
Total Bilirubin: 0.4 mg/dL (ref 0.2–1.2)
Total Protein: 6.8 g/dL (ref 6.0–8.3)

## 2023-05-31 ENCOUNTER — Ambulatory Visit
Admission: RE | Admit: 2023-05-31 | Discharge: 2023-05-31 | Disposition: A | Discharge status: Home / Self Care | Payer: 59 | Source: Ambulatory Visit | Attending: Obstetrics and Gynecology | Admitting: Obstetrics and Gynecology

## 2023-05-31 DIAGNOSIS — Z803 Family history of malignant neoplasm of breast: Secondary | ICD-10-CM

## 2023-05-31 MED ORDER — GADOPICLENOL 0.5 MMOL/ML IV SOLN
6.0000 mL | Freq: Once | INTRAVENOUS | Status: AC | PRN
  Administered 2023-05-31: 6 mL via INTRAVENOUS

## 2023-10-27 ENCOUNTER — Encounter: Payer: Self-pay | Admitting: Psychiatry

## 2023-10-27 ENCOUNTER — Inpatient Hospital Stay: Payer: 59 | Attending: Psychiatry | Admitting: Psychiatry

## 2023-10-27 VITALS — BP 133/74 | HR 80 | Temp 99.2°F | Resp 19 | Wt 134.6 lb

## 2023-10-27 DIAGNOSIS — D071 Carcinoma in situ of vulva: Secondary | ICD-10-CM | POA: Insufficient documentation

## 2023-10-27 NOTE — Patient Instructions (Signed)
 It was a pleasure to see you in clinic today. - Normal exam today - Let us  know if Dr. Leva finds anything we need to be aware of - Return visit planned for 36month  Thank you very much for allowing me to provide care for you today.  I appreciate your confidence in choosing our Gynecologic Oncology team at [facility].  If you have any questions about your visit today please call our office or send us  a MyChart message and we will get back to you as soon as possible.

## 2023-10-27 NOTE — Progress Notes (Signed)
 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 Negative margins for dysplasia.  - NED on exam today  - Reviewed signs/symptoms of recurrence.  - Surveillance reviewed. Follow-up q6 months x2 years then annually.    RTC 22mo.  [name], MD Gynecologic Oncology   Medical Decision Making I personally spent  TOTAL 15 minutes face-to-face and non-face-to-face in the care of this patient, which includes all pre, intra, and post visit time on the date of service.    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  Reports that she underwent a HSG with Dr. Tawnya but there was no abnormality.  Reports that she is scheduled on 11/05/23 with Dr. Tawnya for hysteroscopy D&C.  Otherwise notes that she felt a small knot on her left vulva a couple weeks ago.  Denies any bleeding or itching.  Could not see anything in the mirror.   Past Medical/Surgical History: Past Medical History:  Diagnosis Date   Anxiety    Basal cell carcinoma    nose. inder left nostril x2,  Moh's surgery   Disequilibrium syndrome    many years, chronic lightheadedness with occ period of vertigo, extensive w/u in [place]  and then in [place], many meds tried, no definite dx-->seemed to even out per pt   Dizziness 2011   chronic   History of adenomatous polyp of colon    recall 2025 ([facility])   Menorrhagia    Lysteda as needed per GYN   Mitral valve  prolapse    no surgery,  Last ECHO was 2017,  takes antibx before surgery   Osteopenia    per GYN   Pneumonia 1977    Past Surgical History:  Procedure Laterality Date   COLONOSCOPY W/ POLYPECTOMY  03/02/2019   2020 adenoma (Dr. Luis) recall 2025   DEXA     01/2021 at GYN, T score -1.7   DILATATION & CURETTAGE/HYSTEROSCOPY WITH MYOSURE N/A 02/02/2016   Procedure: DILATATION & CURETTAGE/HYSTEROSCOPY WITH MYOSURE;  Surgeon: Charlie Aho, MD;  Location: [facility];  Service: Gynecology;  Laterality: N/A;   DILATION AND CURETTAGE OF UTERUS N/A 04/15/2023   Endometrial bx benign. Procedure: DILATATION AND CURETTAGE;  Surgeon: Masters Hoy, MD;  Location: [facility];  Service: Gynecology;  Laterality: N/A;   EYE SURGERY  2000   lasix   HYSTEROSCOPY WITH D & C  2020   LASER ABLATION OF THE CERVIX  1987   MOHS SURGERY  2025   MYRINGOTOMY Bilateral 1973   POLYPECTOMY N/A 02/02/2016   Procedure: POLYPECTOMY;  Surgeon: Charlie Aho, MD;  Location: [facility];  Service: Gynecology;  Laterality: N/A;   VULVECTOMY PARTIAL N/A 04/15/2023   Procedure: SIMPLE VULVECTOMY PARTIAL;  Surgeon: Masters Hoy, MD;  Location: [facility];  Service: Gynecology;  Laterality: N/A;   WISDOM TOOTH EXTRACTION  1989    Family History  Problem Relation Age of Onset   High Cholesterol Mother    Multiple myeloma Father    Heart disease  Father    High blood pressure Father    Early death Brother    High blood pressure Brother    Breast cancer Maternal Grandmother    Early death Maternal Grandfather    Heart attack Maternal Grandfather    Diabetes Paternal Grandfather    Prostate cancer Neg Hx    Endometrial cancer Neg Hx    Ovarian cancer Neg Hx    Colon cancer Neg Hx    Pancreatic cancer Neg Hx     Social History   Socioeconomic History   Marital status: Married    Spouse name: Not on file   Number of children: Not on file   Years of education: Not on file   Highest education level: Master's degree  (e.g., MA, MS, MEng, MEd, MSW, MBA)  Occupational History   Not on file  Tobacco Use   Smoking status: Never    Passive exposure: Never   Smokeless tobacco: Never  Vaping Use   Vaping status: Never Used  Substance and Sexual Activity   Alcohol use: Yes    Alcohol/week: 3.0 standard drinks of alcohol    Types: 3 Glasses of wine per week   Drug use: No   Sexual activity: Yes    Birth control/protection: None  Other Topics Concern   Not on file  Social History Narrative   Married, no children.   Orig from [facility] [place] , in  since about 2009.   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   No tob.   Alc a few days a week, 1-2 drinks   Social Drivers of Corporate investment banker Strain: Low Risk  (06/19/2021)   Overall Financial Resource Strain (CARDIA)    Difficulty of Paying Living Expenses: Not hard at all  Food Insecurity: No Food Insecurity (10/23/2023)   Hunger Vital Sign    Worried About Running Out of Food in the Last Year: Never true    Ran Out of Food in the Last Year: Never true  Transportation Needs: No Transportation Needs (10/23/2023)   PRAPARE - Administrator, Civil Service (Medical): No    Lack of Transportation (Non-Medical): No  Physical Activity: Sufficiently Active (06/19/2021)   Exercise Vital Sign    Days of Exercise per Week: 6 days    Minutes of Exercise per Session: 60 min  Stress: No Stress Concern Present (06/19/2021)   [employer] of Occupational Health - Occupational Stress Questionnaire    Feeling of Stress : Not at all  Social Connections: Unknown (01/03/2023)   Received from [facility]   Social Network    Social Network: Not on file    Current Medications:  Current Outpatient Medications:    Calcium 150 MG TABS, Take 300 mg by mouth daily., Disp: , Rfl:    Carboxymethylcellulose Sod PF (REFRESH PLUS) 0.5 % SOLN, Place 1 drop into both eyes daily as needed (dry eyes)., Disp:  , Rfl:    Cholecalciferol (VITAMIN D -3) 25 MCG (1000 UT) CAPS, Take by mouth in the morning and at bedtime., Disp: , Rfl:    hydrOXYzine (VISTARIL) 25 MG capsule, Take 25 mg by mouth as needed for sleep., Disp: , Rfl:    ibuprofen  (ADVIL ) 800 MG tablet, Take 1 tablet (800 mg total) by mouth every 8 (eight) hours as needed for moderate pain (pain score 4-6). For AFTER surgery only, Disp: 30 tablet, Rfl: 0   loratadine (CLARITIN) 10 MG tablet, Take  10 mg by mouth daily., Disp: , Rfl:    Magnesium 300 MG CAPS, Take 300 mg by mouth daily., Disp: , Rfl:    Milk Thistle 150 MG CAPS, Take 150 mg by mouth daily., Disp: , Rfl:    Multiple Vitamins-Minerals (CENTRUM ADULTS) TABS, Take 1 tablet by mouth daily., Disp: , Rfl:    saccharomyces boulardii (FLORASTOR) 250 MG capsule, Take 250 mg by mouth daily as needed. (Patient taking differently: Take 250 mg by mouth daily as needed. Pt takes twice daily), Disp: , Rfl:    Turmeric 500 MG CAPS, Take 500 mg by mouth daily., Disp: , Rfl:    senna-docusate (SENEXON-S) 8.6-50 MG tablet, Take 2 tablets by mouth at bedtime. Do not take if having diarrhea, Disp: 30 tablet, Rfl: 0   tranexamic acid (LYSTEDA) 650 MG TABS tablet, Take 2 tablets by mouth as needed for bleeding., Disp: , Rfl:   Review of Symptoms: Complete 10-system review is negative except as above in Interval History.  Physical Exam: BP 133/74 (BP Location: Left Arm, Patient Position: Sitting)   Pulse 80   Temp 99.2 F (37.3 C) (Oral)   Resp 19   Wt 134 lb 9.6 oz (61.1 kg)   SpO2 100%   BMI 21.08 kg/m  General: Alert, oriented, no acute distress. HEENT: Normocephalic, atraumatic. Neck symmetric without masses. Sclera anicteric.  Chest: Normal work of breathing. Clear to auscultation bilaterally.   Cardiovascular: Regular rate and rhythm, no murmurs. Abdomen: Soft, nontender.  Normoactive bowel sounds. Extremities: Grossly normal range of motion.  Warm, well perfused.  No edema  bilaterally. Skin: No rashes or lesions noted. Lymphatics: No cervical, supraclavicular, or inguinal adenopathy. GU: Normal appearing external genitalia without erythema, excoriation, or lesions.  1 small area on posterior left vulva labia majora with possible resolving ingrown hair that may have been what patient had felt.  Speculum exam reveals normal cervix and vagina.  Bimanual exam reveals benign exam.  Exam chaperoned by Kimberly [place]az[place], CMA    Laboratory & Radiologic Studies: None

## 2024-04-09 LAB — HM COLONOSCOPY

## 2024-04-13 ENCOUNTER — Encounter: Payer: Self-pay | Admitting: Family Medicine

## 2024-04-23 ENCOUNTER — Other Ambulatory Visit: Payer: Self-pay | Admitting: Obstetrics and Gynecology

## 2024-04-23 DIAGNOSIS — Z803 Family history of malignant neoplasm of breast: Secondary | ICD-10-CM

## 2024-04-29 ENCOUNTER — Encounter: Payer: Self-pay | Admitting: Family Medicine

## 2024-04-29 ENCOUNTER — Ambulatory Visit: Payer: 59 | Admitting: Family Medicine

## 2024-04-29 VITALS — BP 119/79 | HR 74 | Temp 97.6°F | Ht 67.0 in | Wt 128.4 lb

## 2024-04-29 DIAGNOSIS — Z0000 Encounter for general adult medical examination without abnormal findings: Secondary | ICD-10-CM

## 2024-04-29 DIAGNOSIS — E559 Vitamin D deficiency, unspecified: Secondary | ICD-10-CM

## 2024-04-29 LAB — COMPREHENSIVE METABOLIC PANEL WITH GFR
ALT: 21 U/L (ref 3–35)
AST: 23 U/L (ref 5–37)
Albumin: 4.3 g/dL (ref 3.5–5.2)
Alkaline Phosphatase: 73 U/L (ref 39–117)
BUN: 18 mg/dL (ref 6–23)
CO2: 35 meq/L — ABNORMAL HIGH (ref 19–32)
Calcium: 9 mg/dL (ref 8.4–10.5)
Chloride: 101 meq/L (ref 96–112)
Creatinine, Ser: 0.58 mg/dL (ref 0.40–1.20)
GFR: 101.94 mL/min
Glucose, Bld: 92 mg/dL (ref 70–99)
Potassium: 3.8 meq/L (ref 3.5–5.1)
Sodium: 140 meq/L (ref 135–145)
Total Bilirubin: 0.5 mg/dL (ref 0.2–1.2)
Total Protein: 6.7 g/dL (ref 6.0–8.3)

## 2024-04-29 LAB — LIPID PANEL
Cholesterol: 158 mg/dL (ref 28–200)
HDL: 90.8 mg/dL
LDL Cholesterol: 61 mg/dL (ref 10–99)
NonHDL: 67.66
Total CHOL/HDL Ratio: 2
Triglycerides: 34 mg/dL (ref 10.0–149.0)
VLDL: 6.8 mg/dL (ref 0.0–40.0)

## 2024-04-29 LAB — CBC WITH DIFFERENTIAL/PLATELET
Basophils Absolute: 0 10*3/uL (ref 0.0–0.1)
Basophils Relative: 1.1 % (ref 0.0–3.0)
Eosinophils Absolute: 0.1 10*3/uL (ref 0.0–0.7)
Eosinophils Relative: 2.5 % (ref 0.0–5.0)
HCT: 41.8 % (ref 36.0–46.0)
Hemoglobin: 14.3 g/dL (ref 12.0–15.0)
Lymphocytes Relative: 30.1 % (ref 12.0–46.0)
Lymphs Abs: 1.4 10*3/uL (ref 0.7–4.0)
MCHC: 34.3 g/dL (ref 30.0–36.0)
MCV: 94.1 fl (ref 78.0–100.0)
Monocytes Absolute: 0.3 10*3/uL (ref 0.1–1.0)
Monocytes Relative: 6.7 % (ref 3.0–12.0)
Neutro Abs: 2.7 10*3/uL (ref 1.4–7.7)
Neutrophils Relative %: 59.6 % (ref 43.0–77.0)
Platelets: 274 10*3/uL (ref 150.0–400.0)
RBC: 4.44 Mil/uL (ref 3.87–5.11)
RDW: 12.9 % (ref 11.5–15.5)
WBC: 4.5 10*3/uL (ref 4.0–10.5)

## 2024-04-29 LAB — TSH: TSH: 3.57 u[IU]/mL (ref 0.35–5.50)

## 2024-04-29 LAB — VITAMIN D 25 HYDROXY (VIT D DEFICIENCY, FRACTURES): VITD: 49.28 ng/mL (ref 30.00–100.00)

## 2024-04-29 NOTE — Patient Instructions (Signed)

## 2024-04-29 NOTE — Progress Notes (Signed)
 "     Office Note 04/29/2024  CC:  Chief Complaint  Patient presents with   Annual Exam    Pt is fasting   HPI: Patient is a 54 y.o. female who is here for annual health maintenance exam.  Overall she is doing well other than some ongoing problems with dysfunctional uterine bleeding.  Her GYN is following her closely.  She started HRT a few months ago and has follow-up with her GYN soon.  Past Medical History:  Diagnosis Date   Anxiety    Basal cell carcinoma    nose. inder left nostril x2,  Moh's surgery   Disequilibrium syndrome    many years, chronic lightheadedness with occ period of vertigo, extensive w/u in [place]  and then in [place], many meds tried, no definite dx-->seemed to even out per pt   Dizziness 2011   chronic   History of adenomatous polyp of colon    recall 2025 ([facility])   Menorrhagia    Lysteda as needed per GYN   Mitral valve prolapse    no surgery,  Last ECHO was 2017,  takes antibx before surgery   Osteopenia    per GYN    Past Surgical History:  Procedure Laterality Date   COLONOSCOPY W/ POLYPECTOMY  03/02/2019   2020 adenoma (Dr. Luis).  04/09/24 Dr. Kristie, polyp x 1   DEXA     01/2021 at GYN, T score -1.7   DILATATION & CURETTAGE/HYSTEROSCOPY WITH MYOSURE N/A 02/02/2016   Procedure: DILATATION & CURETTAGE/HYSTEROSCOPY WITH MYOSURE;  Surgeon: Charlie Aho, MD;  Location: [facility];  Service: Gynecology;  Laterality: N/A;   DILATION AND CURETTAGE OF UTERUS N/A 04/15/2023   Endometrial bx benign. Procedure: DILATATION AND CURETTAGE;  Surgeon: Eldonna Mays, MD;  [facility];  Service: Gynecology;  Laterality: N/A;   EYE SURGERY  2000   lasix   HYSTEROSCOPY WITH D & C  2020   LASER ABLATION OF THE CERVIX  1987   MOHS SURGERY  2025   MYRINGOTOMY Bilateral 1973   POLYPECTOMY N/A 02/02/2016   Procedure: POLYPECTOMY;  Surgeon: Charlie Aho, MD;  Location: [facility];  Service: Gynecology;  Laterality: N/A;   VULVECTOMY PARTIAL N/A 04/15/2023    Procedure: SIMPLE VULVECTOMY PARTIAL;  Surgeon: Eldonna Mays, MD;  [facility];  Service: Gynecology;  Laterality: N/A;   WISDOM TOOTH EXTRACTION  1989    Family History  Problem Relation Age of Onset   High Cholesterol Mother    Cancer Mother    Multiple myeloma Father    Heart disease Father    High blood pressure Father    Cancer Father    Early death Brother    High blood pressure Brother    Breast cancer Maternal Grandmother    Cancer Maternal Grandmother    Early death Maternal Grandfather    Heart attack Maternal Grandfather    Diabetes Paternal Grandfather    Early death Brother    Hearing loss Maternal Aunt    Prostate cancer Neg Hx    Endometrial cancer Neg Hx    Ovarian cancer Neg Hx    Colon cancer Neg Hx    Pancreatic cancer Neg Hx     Social History   Socioeconomic History   Marital status: Married    Spouse name: Not on file   Number of children: Not on file   Years of education: Not on file   Highest education level: Master's degree (e.g., MA, MS, MEng, MEd, MSW, MBA)  Occupational History   Not on file  Tobacco Use   Smoking status: Never    Passive exposure: Never   Smokeless tobacco: Never  Vaping Use   Vaping status: Never Used  Substance and Sexual Activity   Alcohol use: Not Currently    Alcohol/week: 3.0 standard drinks of alcohol    Comment: Not currently drinking alcohol since taking HRT.   Drug use: Never   Sexual activity: Not Currently    Birth control/protection: Abstinence, None  Other Topics Concern   Not on file  Social History Narrative   Married, no children.   [place] , in [place] about [place].   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   No tob.   Alc a few days a week, 1-2 drinks   Social Drivers of Health   Tobacco Use: Low Risk (04/29/2024)   Patient History    Smoking Tobacco Use: Never    Smokeless Tobacco Use: Never    Passive Exposure: Never   Financial Resource Strain: Low Risk (04/27/2024)   Overall Financial Resource Strain (CARDIA)    Difficulty of Paying Living Expenses: Not hard at all  Food Insecurity: No Food Insecurity (04/27/2024)   Epic    Worried About Radiation Protection Practitioner of Food in the Last Year: Never true    Ran Out of Food in the Last Year: Never true  Transportation Needs: No Transportation Needs (04/27/2024)   Epic    Lack of Transportation (Medical): No    Lack of Transportation (Non-Medical): No  Physical Activity: Sufficiently Active (04/27/2024)   Exercise Vital Sign    Days of Exercise per Week: 3 days    Minutes of Exercise per Session: 60 min  Stress: No Stress Concern Present (04/27/2024)   [employer] of Occupational Health - Occupational Stress Questionnaire    Feeling of Stress: Not at all  Social Connections: Socially Integrated (04/27/2024)   Social Connection and Isolation Panel    Frequency of Communication with Friends and Family: Twice a week    Frequency of Social Gatherings with Friends and Family: More than three times a week    Attends Religious Services: 1 to 4 times per year    Active Member of [employer] or Organizations: Yes    Attends Banker Meetings: Never    Marital Status: Married  Catering Manager Violence: Not At Risk (10/23/2023)   Epic    Fear of Current or Ex-Partner: No    Emotionally Abused: No    Physically Abused: No    Sexually Abused: No  Depression (PHQ2-9): Low Risk (04/29/2024)   Depression (PHQ2-9)    PHQ-2 Score: 0  Alcohol Screen: Low Risk (04/27/2024)   Alcohol Screen    Last Alcohol Screening Score (AUDIT): 3  Housing: Low Risk (04/27/2024)   Epic    Unable to Pay for Housing in the Last Year: No    Number of Times Moved in the Last Year: 0    Homeless in the Last Year: No  Utilities: Not At Risk (10/23/2023)   Epic    Threatened with loss of utilities: No  Health Literacy: Not on file    Outpatient Medications Prior to Visit  Medication Sig Dispense  Refill   Calcium 150 MG TABS Take 300 mg by mouth daily.     Carboxymethylcellulose Sod PF (REFRESH PLUS) 0.5 % SOLN Place 1 drop into both eyes daily as needed (dry eyes).  Cetirizine HCl (ZYRTEC ALLERGY) 10 MG CAPS Take by mouth daily.     Cholecalciferol (VITAMIN D -3) 25 MCG (1000 UT) CAPS Take by mouth in the morning and at bedtime.     hydrOXYzine (VISTARIL) 25 MG capsule Take 25 mg by mouth as needed for sleep.     Magnesium 300 MG CAPS Take 300 mg by mouth daily.     Milk Thistle 150 MG CAPS Take 150 mg by mouth daily.     Multiple Vitamins-Minerals (CENTRUM ADULTS) TABS Take 1 tablet by mouth daily.     Omega-3 Fatty Acids (OMEGA 3 PO) daily.     omeprazole-sodium bicarbonate (ZEGERID) 40-1100 MG capsule Take 1 capsule by mouth daily.     OVER THE COUNTER MEDICATION NATURE BOUNTY IMMUNE     saccharomyces boulardii (FLORASTOR) 250 MG capsule Take 250 mg by mouth daily as needed. (Patient taking differently: Take 250 mg by mouth daily as needed. Pt takes twice daily)     Turmeric 500 MG CAPS Take 500 mg by mouth daily.     estradiol (VIVELLE-DOT) 0.05 MG/24HR patch Place 1 patch onto the skin 2 (two) times a week.     progesterone (PROMETRIUM) 100 MG capsule Take 100 mg by mouth at bedtime.     loratadine (CLARITIN) 10 MG tablet Take 10 mg by mouth daily.     No facility-administered medications prior to visit.    Allergies[1]  Review of Systems  Constitutional:  Negative for appetite change, chills, fatigue and fever.  HENT:  Negative for congestion, dental problem, ear pain and sore throat.   Eyes:  Negative for discharge, redness and visual disturbance.  Respiratory:  Negative for cough, chest tightness, shortness of breath and wheezing.   Cardiovascular:  Negative for chest pain, palpitations and leg swelling.  Gastrointestinal:  Negative for abdominal pain, blood in stool, diarrhea, nausea and vomiting.  Genitourinary:  Negative for difficulty urinating, dysuria, flank  pain, frequency, hematuria and urgency.  Musculoskeletal:  Negative for arthralgias, back pain, joint swelling, myalgias and neck stiffness.  Skin:  Negative for pallor and rash.  Neurological:  Negative for dizziness, speech difficulty, weakness and headaches.  Hematological:  Negative for adenopathy. Does not bruise/bleed easily.  Psychiatric/Behavioral:  Negative for confusion and sleep disturbance. The patient is not nervous/anxious.     PE;    04/29/2024    8:08 AM 10/27/2023    1:23 PM 04/29/2023    8:10 AM  Vitals with BMI  Height 5' 7  5' 7  Weight 128 lbs 6 oz 134 lbs 10 oz 129 lbs 3 oz  BMI 20.11  20.23  Systolic 119 133 870  Diastolic 79 74 80  Pulse 74 80 87    Exam chaperoned by Cloe Motsinger, CMA Gen: Alert, well appearing.  Patient is oriented to person, place, time, and situation. AFFECT: pleasant, lucid thought and speech. ENT: Ears: EACs clear, normal epithelium.  TMs with good light reflex and landmarks bilaterally.  Eyes: no injection, icteris, swelling, or exudate.  EOMI, PERRLA. Nose: no drainage or turbinate edema/swelling.  No injection or focal lesion.  Mouth: lips without lesion/swelling.  Oral mucosa pink and moist.  Dentition intact and without obvious caries or gingival swelling.  Oropharynx without erythema, exudate, or swelling.  Neck: supple/nontender.  No LAD, mass, or TM.  Carotid pulses 2+ bilaterally, without bruits. CV: RRR, no m/r/g.   LUNGS: CTA bilat, nonlabored resps, good aeration in all lung fields. ABD: soft, NT, ND, BS normal.  No hepatospenomegaly or mass.  No bruits. EXT: no clubbing, cyanosis, or edema.  Musculoskeletal: no joint swelling, erythema, warmth, or tenderness.  ROM of all joints intact. Skin - no sores or suspicious lesions or rashes or color changes  Pertinent labs:  Lab Results  Component Value Date   TSH 2.58 04/29/2023   Lab Results  Component Value Date   WBC 7.1 04/29/2023   HGB 14.0 04/29/2023   HCT 41.0  04/29/2023   MCV 95.1 04/29/2023   PLT 372.0 04/29/2023   Lab Results  Component Value Date   CREATININE 0.59 04/29/2023   BUN 15 04/29/2023   NA 140 04/29/2023   K 4.1 04/29/2023   CL 100 04/29/2023   CO2 32 04/29/2023   Lab Results  Component Value Date   ALT 22 05/27/2023   AST 23 05/27/2023   ALKPHOS 81 05/27/2023   BILITOT 0.4 05/27/2023   Lab Results  Component Value Date   CHOL 170 04/29/2023   Lab Results  Component Value Date   HDL 81.40 04/29/2023   Lab Results  Component Value Date   LDLCALC 79 04/29/2023   Lab Results  Component Value Date   TRIG 48.0 04/29/2023   Lab Results  Component Value Date   CHOLHDL 2 04/29/2023   Lab Results  Component Value Date   HGBA1C 5.6 04/29/2023   Last vitamin D  Lab Results  Component Value Date   VD25OH 55 04/29/2023   ASSESSMENT AND PLAN:   #1 health maintenance exam: Reviewed age and gender appropriate health maintenance issues (prudent diet, regular exercise, health risks of tobacco and excessive alcohol, use of seatbelts, fire alarms in home, use of sunscreen).  Also reviewed age and gender appropriate health screening as well as vaccine recommendations. Vaccines: Prevnar 20->UTD. Labs: Fasting health panel today Cervical ca screening: UTD with GYN MD. Breast ca screening: She'll be getting breast MRI 09/04/24 (routine screening for her). Colon ca screening: 04/09/24-->1 polyp (Dr. Kristie).  Rpt 5 yrs.  #2 vitamin D  deficiency. She is on 1000 units vitamin D  daily. Check level today.  An After Visit Summary was printed and given to the patient.  FOLLOW UP:  Return in about 1 year (around 04/29/2025) for annual CPE (fasting).  Signed:  Gerlene Hockey, MD           04/29/2024     [1]  Allergies Allergen Reactions   Codeine Nausea And Vomiting   Lorazepam Other (See Comments)    agitation   Xyrem [Oxybate] Nausea And Vomiting   "

## 2024-04-30 ENCOUNTER — Ambulatory Visit: Payer: Self-pay | Admitting: Family Medicine

## 2024-04-30 ENCOUNTER — Encounter: Payer: Self-pay | Admitting: Family Medicine

## 2024-05-03 ENCOUNTER — Inpatient Hospital Stay: Admitting: Psychiatry

## 2024-05-03 DIAGNOSIS — D071 Carcinoma in situ of vulva: Secondary | ICD-10-CM

## 2024-09-04 ENCOUNTER — Other Ambulatory Visit

## 2025-05-04 ENCOUNTER — Encounter: Admitting: Family Medicine
